# Patient Record
Sex: Male | Born: 1940 | Race: White | Hispanic: No | Marital: Single | Smoking: Never smoker
Health system: Southern US, Community
[De-identification: ages and names within clinical notes are randomized; demographics above are authoritative.]

## PROBLEM LIST (undated history)

## (undated) DIAGNOSIS — I493 Ventricular premature depolarization: Secondary | ICD-10-CM

## (undated) DIAGNOSIS — I10 Essential (primary) hypertension: Secondary | ICD-10-CM

## (undated) DIAGNOSIS — I4891 Unspecified atrial fibrillation: Secondary | ICD-10-CM

## (undated) DIAGNOSIS — I499 Cardiac arrhythmia, unspecified: Secondary | ICD-10-CM

## (undated) DIAGNOSIS — F419 Anxiety disorder, unspecified: Secondary | ICD-10-CM

## (undated) DIAGNOSIS — N402 Nodular prostate without lower urinary tract symptoms: Secondary | ICD-10-CM

## (undated) DIAGNOSIS — G4733 Obstructive sleep apnea (adult) (pediatric): Secondary | ICD-10-CM

## (undated) DIAGNOSIS — R002 Palpitations: Secondary | ICD-10-CM

## (undated) DIAGNOSIS — M353 Polymyalgia rheumatica: Secondary | ICD-10-CM

## (undated) DIAGNOSIS — E119 Type 2 diabetes mellitus without complications: Secondary | ICD-10-CM

## (undated) DIAGNOSIS — E538 Deficiency of other specified B group vitamins: Secondary | ICD-10-CM

## (undated) HISTORY — DX: Type 2 diabetes mellitus without complications: E11.9

## (undated) HISTORY — DX: Polymyalgia rheumatica: M35.3

## (undated) HISTORY — DX: Palpitations: R00.2

## (undated) HISTORY — PX: NO PAST SURGERIES: SHX2092

## (undated) HISTORY — DX: Nodular prostate without lower urinary tract symptoms: N40.2

## (undated) HISTORY — DX: Cardiac arrhythmia, unspecified: I49.9

## (undated) HISTORY — DX: Anxiety disorder, unspecified: F41.9

## (undated) HISTORY — DX: Ventricular premature depolarization: I49.3

## (undated) HISTORY — DX: Obstructive sleep apnea (adult) (pediatric): G47.33

## (undated) HISTORY — DX: Essential (primary) hypertension: I10

## (undated) HISTORY — DX: Deficiency of other specified B group vitamins: E53.8

## (undated) HISTORY — DX: Unspecified atrial fibrillation: I48.91

---

## 2003-12-27 ENCOUNTER — Ambulatory Visit: Payer: Self-pay | Admitting: Family Medicine

## 2004-02-07 ENCOUNTER — Ambulatory Visit: Payer: Self-pay | Admitting: Family Medicine

## 2004-03-26 ENCOUNTER — Ambulatory Visit: Payer: Self-pay | Admitting: Family Medicine

## 2004-08-23 ENCOUNTER — Ambulatory Visit: Payer: Self-pay | Admitting: Family Medicine

## 2005-01-08 ENCOUNTER — Ambulatory Visit: Payer: Self-pay | Admitting: Family Medicine

## 2005-02-25 ENCOUNTER — Ambulatory Visit: Payer: Self-pay | Admitting: Family Medicine

## 2008-09-19 ENCOUNTER — Ambulatory Visit: Payer: Self-pay | Admitting: Cardiovascular Disease

## 2008-10-17 ENCOUNTER — Ambulatory Visit: Payer: Self-pay | Admitting: Cardiovascular Disease

## 2009-02-23 ENCOUNTER — Ambulatory Visit: Payer: Self-pay | Admitting: Cardiovascular Disease

## 2009-03-28 ENCOUNTER — Encounter: Payer: Self-pay | Admitting: Cardiovascular Disease

## 2010-03-20 NOTE — Procedures (Signed)
Summary: summary report  summary report   Imported By: Mirna Mires 03/28/2009 15:49:49  _____________________________________________________________________  External Attachment:    Type:   Image     Comment:   External Document

## 2010-07-03 NOTE — Assessment & Plan Note (Signed)
[facility]                        [place] [facility] OFFICE NOTE   NAME:SCALPATIDaschel, Roughton                      MRN:          [id number]  DATE:02/23/2009                            DOB:          06/26/1940    PROBLEM LIST:  1. Hypertension.  2. Obstructive sleep apnea.  3. Obesity.  4. Diabetes.  5. Premature atrial complexes and premature ventricular complexes      during a sleep study.   INTERVAL HISTORY:  Since his last visit, the patient is doing quite  well.  He denies any episodes of chest discomfort or significant  shortness of breath.  He recently had a death in his family that was  upsetting him.  He continues to wear CPAP mask for obstructive sleep  apnea, although he states it has been uncomfortable, however that has  been improving slightly.   PHYSICAL EXAM:  VITAL SIGNS:  Today, his blood pressure is 120/61, pulse  88, satting 96% on room air, and he weighs 231 pounds which is 5 pounds  more than he weighed at the end of August.  GENERAL:  He is in no acute distress.  HEENT:  Normocephalic, atraumatic.  NECK:  Supple.  No JVD.  No carotid bruits.  HEART:  Regular rate and rhythm without murmur, rub, or gallop.  LUNGS:  Clear bilaterally.  ABDOMEN:  Soft, nontender, nondistended.  EXTREMITIES:  Without edema.  SKIN:  Warm and dry.   I reviewed the patient's labs.  His LDL is 47, his HDL is 44, total  cholesterol is 111, his triglycerides 101.  His hemoglobin A1c is 7.3,  fasting glucose is 127.   ASSESSMENT/PLAN:  1. Hypertension.  The patient's blood pressure is under good control.      He should continue hydrochlorothiazide 12.5 mg daily and Exforge      which is a combination of amlodipine and valsartan 10/320 mg daily.  2. Obesity.  The patient is encouraged to exercise more regularly and      more frequently a total of 30 minutes a day everyday of the week      should be his current goal.  He is reminded that as he lose  weight,      his blood pressure, hypertension, and obstructive sleep apnea are      likely to improve.  3. Lipids.  The patient is on 5 mg of Lipitor and his fasting lipid      profile is excellent.   The patient is followed regularly by Dr. Sol Passer who is addressing his  cardiovascular risk factors.  The patient is instructed to contact our  office in the future if we can be of any further assistance.     Brayton El, MD  Electronically Signed    SGA/MedQ  DD: 02/23/2009  DT: 02/24/2009  Job #: [id number]   cc:   Dina Rich

## 2010-07-03 NOTE — Assessment & Plan Note (Signed)
[facility]                        [place] [facility] OFFICE NOTE   NAME:Wayne [name], Wayne [name]                      MRN:          [id number]  DATE:09/19/2008                            DOB:          26-Sep-1940    CHIEF COMPLAINT:  Obstructive sleep apnea and palpitations.   HISTORY OF PRESENT ILLNESS:  Wayne [name] is a 69 year old white male,  past medical history significant for a recent diagnosis of obstructive  sleep apnea, hypertension, very well controlled diabetes, who is  presenting for evaluation of some arrhythmias during his sleep study.  Patient states that he has been in his normal state of health.  He sees  Dr. Sol Passer on a regular basis who treats him for hypertension and  diabetes.  He states his diabetes has been in excellent control.  Recently, a sleep study was ordered which was positive for obstructive  sleep apnea.  It was also noted that during this sleep study the patient  had multiple PACs and PVCs.  Patient states that at night he  occasionally feels palpitations but they are not bothersome to him.  His  wife states that he snores a fair amount.  Patient also endorses  sleepiness during the daytime.  He denies any episodes of chest  discomfort.  He states if he walks long distances he gets short of  breath, however, this has been the case for quite some time now and has  not changed recently.  He denies any dizziness or syncopal episodes.   PAST MEDICAL HISTORY:  As above.   SOCIAL HISTORY:  No tobacco.  He drinks 2 to 3 glasses of wine daily.  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   FAMILY HISTORY:  He had a father who died in his 17s from some type of  heart problem although he is not sure what the heart problem is.  He  states he has 4 or 5 siblings that are all older than him that have not  had any cardiac issues.   ALLERGIES:  NO KNOWN DRUG ALLERGIES.   MEDICATIONS:  1. Aspirin 81 mg daily.  2.  Hydrochlorothiazide 12.5 mg daily.  3. Exforge 10/320 mg daily.  4. Lipitor 5 mg daily.  5. Glimepiride 2 mg p.r.n.  6. Actoplus Metformin 15/850 t.i.d.   REVIEW OF SYSTEMS:  As in HPI.  All other systems were reviewed and are  negative.   PHYSICAL EXAM:  Pulse 91.  Blood pressure 152/75.  Saturating 97% on  room air.  He weighs 222 pounds.  GENERAL:  He is in no acute distress.  HEENT:  Nonfocal.  Extraocular movements are intact.  NECK:  Supple.  There is no JVD.  There is no carotid bruit.  There is  no lymphadenopathy.  HEART:  Regular rate and rhythm without murmur, rub, or gallop.  LUNGS:  Clear bilaterally.  ABDOMEN:  Soft and nontender, however, there is abdominal obesity.  EXTREMITIES:  Without edema.  SKIN:  Warm and dry without evidence of rash or lesion.  NEURO:  Exam is nonfocal.  PSYCHIATRIC:  Patient is appropriate with  normal levels of insight.  PULSES:  The patient has 2+ bilateral carotid, radial, and posterior  tibial pulses.   EKG independently reviewed by myself demonstrates normal sinus rhythm  without any complete right bundle branch block.  There are no ST or T-  wave abnormalities. Patient's last hemoglobin A1c as of March of this  year was 6.3.  Review of the results of the patient's transthoracic  echocardiogram dated May 05, 2008, shows a ejection fraction of 65%,  possible diastolic dysfunction, and possible left atrial enlargement.   ASSESSMENT:  This is a 70 year old white male with a new diagnosis of  obstructive sleep apnea.  During a sleep study, he appeared to have  multiple episodes of premature atrial contractions and premature  ventricular contractions which is not unexpected in someone with his  degree of obstructive sleep apnea.  He is currently not having any signs  or symptoms of obstructive coronary disease or congestive heart failure.  His hypertension per him is well controlled as he checks it at home  often and it is always within  normal limits even though it is slightly  elevated today in the office.  His diabetes is also under excellent  control per his last hemoglobin A1c.   PLAN:  Patient is scheduled to undergo titration of CPAP tomorrow for  his obstructive sleep apnea.  He is counseled at length as for the need  and the importance of regularly wearing his CPAP mask in the evenings.  It is likely that adequate treatment of the obstructive sleep apnea will  lead to improvement in the patient's arrhythmia although the arrhythmia  is rather benign.  Patient is also counseled regarding weight loss which  should help address the degree of obstructive sleep apnea that he has.  Once the patient has been wearing his CPAP mask for a week or so, we  would like to place him on a 48-hour Holter monitor to assure that the  only arrhythmias that he is having are benign.  We will see the patient  back in approximately 4 weeks' time.     Brayton El, MD  Electronically Signed    SGA/MedQ  DD: 09/19/2008  DT: 09/19/2008  Job #: [id number]

## 2010-07-03 NOTE — Letter (Signed)
September 19, 2008    Dr. Dina Rich  [address]  [place], [place] [address]   RE:  Wayne, [name]  MRN:  [id number]  /  DOB:  12-Jan-1941   Dear Dr. Sol Passer,   I had the pleasure of seeing your patient, Wayne [name], in the  [facility] today.  As you know, he is a 70 year old  [place] American male who has recently been diagnosed with obstructive  sleep apnea.  During his sleep study it appears that he had multiple  episodes of PACs and PVCs, which is not surprising considering his  degree of obstructive sleep apnea.  In speaking with him it does not  appear that he has any signs or symptoms of obstructive coronary disease  and that his risk factors for coronary disease have been very well  managed.  He is currently scheduled to undergo CPAP titration.  I have  counseled him at length regarding the need for compliance with the CPAP  and regarding the need for weight loss in order to improved the symptoms  of his obstructive sleep apnea.  Once he is on the CPAP I will place him  on a cardiac event monito to assure that he is not having any  arrhythmias other than PACs and PVCs.  Lastly, his echocardiogram from  earlier on in the year demonstrated normal left ventricular systolic  function and some evidence of diastolic dysfunction which again is not  uncommon for someone in his age group.  At this time I see no need for  further imaging studies.  However I will see him back in four week's  time to gauge his response to the CPAP therapy, to go over the results  of his Holter Monitor with him, and reevaluate his cardiac status.  I  hope that you are happy with this approach.  Thank you again for the  opportunity to see this patient.  Please feel free to contact my office  at any time with any questions or concerns.    Sincerely,      Wayne El, MD  Electronically Signed    SGA/MedQ  DD: 09/19/2008  DT: 09/19/2008  Job #: [id number]

## 2010-07-03 NOTE — Assessment & Plan Note (Signed)
[facility]                        [place] [facility] OFFICE NOTE   NAME:SCALPATIKamaree, Wheatley                      MRN:          [id number]  DATE:10/17/2008                            DOB:          11/26/1940    PROBLEM LIST:  1. Hypertension.  2. Obstructive sleep apnea.  3. Diabetes.  4. Premature atrial contractions and premature ventricular      contractions during a sleep study.   INTERVAL HISTORY:  Since Mr. Scheier last visit, he wore a 48-hour  Holter monitor that showed some episodes of sinus tachycardia that  occurred during some yard work outside.  Other than that, there were no  significant arrhythmias.  The patient states that he continues to be  getting along well.  He has worn the CPAP mask for approximately 9 days  and is tolerating it reasonably well.  He states that the CPAP is set to  start at 5 and to self-titrate up throughout the night, and that this  self-titration occasionally wakes him up; however, he was told that he  is likely get used to this over the next couple of weeks and will no  longer wake up at night.  The patient states he has not had any episodes  of chest discomfort or dyspnea on exertion.  He also denies any  dizziness, syncope or palpitations.  He is compliant with all of his  medications.  He has been taking his blood pressure in the mornings and  that systolic blood pressures have been in the high 130s.   REVIEW OF SYSTEMS:  As in the HPI, otherwise negative.   PHYSICAL EXAMINATION:  The patient has a blood pressure of 141/70, pulse  91, sating 98% on room air.  He weights 226 pounds.  GENERAL:  He is in no acute distress.  HEENT:  Nonfocal.  NECK:  Supple.  There are no carotid bruits, there is no JVD.  HEART:  Regular rate and rhythm without murmur, rub or gallop.  LUNGS:  Clear to auscultation bilaterally.  ABDOMEN:  Soft, nontender, nondistended.  EXTREMITIES:  Without edema.  PSYCHIATRIC:  The  patient is appropriate with normal levels of insight.   Review of the patient's Holter monitor as above in the HPI.   ASSESSMENT AND PLAN:  The patient is doing well from a cardiovascular  standpoint.  His blood pressure is under marginal control, and we  recommend that he check his blood pressure and record it every morning  and every evening for one week.  The patient will drop off his blood  pressure readings with [place] and we will titrate his blood pressure  medicines accordingly.  It is likely that we will increase his  hydrochlorothiazide from 12.5 mg daily to 25 mg daily.  The patient is  not displaying any symptoms consistent with obstructive coronary disease  or  malignant arrhythmia; therefore, we will not undergo any further testing  or intervention at this  time.  The patient will contact our office if any problems were to  arise.  Otherwise, we will see him in followup in 4 months time.  Brayton El, MD  Electronically Signed    SGA/MedQ  DD: 10/17/2008  DT: 10/17/2008  Job #: [id number]

## 2010-11-06 ENCOUNTER — Encounter: Payer: Self-pay | Admitting: Cardiovascular Disease

## 2010-11-08 ENCOUNTER — Encounter: Payer: Self-pay | Admitting: Cardiovascular Disease

## 2010-12-10 DIAGNOSIS — R972 Elevated prostate specific antigen [PSA]: Secondary | ICD-10-CM

## 2010-12-10 HISTORY — DX: Elevated prostate specific antigen (PSA): R97.20

## 2010-12-27 ENCOUNTER — Encounter: Payer: Self-pay | Admitting: Cardiovascular Disease

## 2012-01-20 ENCOUNTER — Observation Stay (HOSPITAL_COMMUNITY)
Admission: EM | Admit: 2012-01-20 | Discharge: 2012-01-21 | Disposition: A | Discharge status: Home / Self Care | Payer: Medicare Other | Attending: Internal Medicine | Admitting: Internal Medicine

## 2012-01-20 ENCOUNTER — Emergency Department (HOSPITAL_COMMUNITY): Payer: Medicare Other

## 2012-01-20 ENCOUNTER — Encounter (HOSPITAL_COMMUNITY): Payer: Self-pay | Admitting: *Deleted

## 2012-01-20 DIAGNOSIS — I959 Hypotension, unspecified: Secondary | ICD-10-CM | POA: Diagnosis present

## 2012-01-20 DIAGNOSIS — E119 Type 2 diabetes mellitus without complications: Secondary | ICD-10-CM

## 2012-01-20 DIAGNOSIS — E782 Mixed hyperlipidemia: Secondary | ICD-10-CM | POA: Diagnosis present

## 2012-01-20 DIAGNOSIS — I119 Hypertensive heart disease without heart failure: Secondary | ICD-10-CM | POA: Diagnosis not present

## 2012-01-20 DIAGNOSIS — N179 Acute kidney failure, unspecified: Secondary | ICD-10-CM

## 2012-01-20 DIAGNOSIS — E860 Dehydration: Secondary | ICD-10-CM

## 2012-01-20 DIAGNOSIS — Z9989 Dependence on other enabling machines and devices: Secondary | ICD-10-CM

## 2012-01-20 DIAGNOSIS — E669 Obesity, unspecified: Secondary | ICD-10-CM | POA: Diagnosis present

## 2012-01-20 DIAGNOSIS — Z79899 Other long term (current) drug therapy: Secondary | ICD-10-CM | POA: Insufficient documentation

## 2012-01-20 DIAGNOSIS — G4733 Obstructive sleep apnea (adult) (pediatric): Secondary | ICD-10-CM | POA: Diagnosis present

## 2012-01-20 DIAGNOSIS — I498 Other specified cardiac arrhythmias: Principal | ICD-10-CM | POA: Insufficient documentation

## 2012-01-20 DIAGNOSIS — I471 Supraventricular tachycardia, unspecified: Secondary | ICD-10-CM

## 2012-01-20 DIAGNOSIS — E785 Hyperlipidemia, unspecified: Secondary | ICD-10-CM

## 2012-01-20 DIAGNOSIS — R42 Dizziness and giddiness: Secondary | ICD-10-CM | POA: Insufficient documentation

## 2012-01-20 DIAGNOSIS — R197 Diarrhea, unspecified: Secondary | ICD-10-CM | POA: Insufficient documentation

## 2012-01-20 DIAGNOSIS — I10 Essential (primary) hypertension: Secondary | ICD-10-CM | POA: Insufficient documentation

## 2012-01-20 DIAGNOSIS — D649 Anemia, unspecified: Secondary | ICD-10-CM | POA: Diagnosis present

## 2012-01-20 DIAGNOSIS — R031 Nonspecific low blood-pressure reading: Secondary | ICD-10-CM | POA: Insufficient documentation

## 2012-01-20 HISTORY — DX: Type 2 diabetes mellitus without complications: E11.9

## 2012-01-20 HISTORY — DX: Mixed hyperlipidemia: E78.2

## 2012-01-20 HISTORY — DX: Hypotension, unspecified: I95.9

## 2012-01-20 HISTORY — DX: Hypertensive heart disease without heart failure: I11.9

## 2012-01-20 HISTORY — DX: Supraventricular tachycardia, unspecified: I47.10

## 2012-01-20 HISTORY — DX: Obstructive sleep apnea (adult) (pediatric): G47.33

## 2012-01-20 HISTORY — DX: Acute kidney failure, unspecified: N17.9

## 2012-01-20 HISTORY — DX: Dehydration: E86.0

## 2012-01-20 HISTORY — DX: Supraventricular tachycardia: I47.1

## 2012-01-20 LAB — CBC
HCT: 37 % — ABNORMAL LOW (ref 39.0–52.0)
HCT: 31 % — ABNORMAL LOW (ref 39.0–52.0)
Hemoglobin: 12.2 g/dL — ABNORMAL LOW (ref 13.0–17.0)
Hemoglobin: 10.2 g/dL — ABNORMAL LOW (ref 13.0–17.0)
MCH: 29.5 pg (ref 26.0–34.0)
MCH: 29.6 pg (ref 26.0–34.0)
MCHC: 33 g/dL (ref 30.0–36.0)
MCHC: 32.9 g/dL (ref 30.0–36.0)
MCV: 89.6 fL (ref 78.0–100.0)
Platelets: 192 10*3/uL (ref 150–400)
RBC: 4.13 MIL/uL — ABNORMAL LOW (ref 4.22–5.81)
RDW: 14.5 % (ref 11.5–15.5)
RDW: 14.4 % (ref 11.5–15.5)
WBC: 8.2 10*3/uL (ref 4.0–10.5)
WBC: 4.6 10*3/uL (ref 4.0–10.5)

## 2012-01-20 LAB — BASIC METABOLIC PANEL
BUN: 49 mg/dL — ABNORMAL HIGH (ref 6–23)
CO2: 17 mEq/L — ABNORMAL LOW (ref 19–32)
Calcium: 9.4 mg/dL (ref 8.4–10.5)
Chloride: 102 mEq/L (ref 96–112)
Creatinine, Ser: 2.35 mg/dL — ABNORMAL HIGH (ref 0.50–1.35)
GFR calc Af Amer: 30 mL/min — ABNORMAL LOW (ref >90)
GFR calc non Af Amer: 26 mL/min — ABNORMAL LOW (ref >90)
Glucose, Bld: 212 mg/dL — ABNORMAL HIGH (ref 70–99)
Potassium: 4.1 mEq/L (ref 3.5–5.1)
Sodium: 135 mEq/L (ref 135–145)

## 2012-01-20 LAB — GLUCOSE, CAPILLARY: Glucose-Capillary: 157 mg/dL — ABNORMAL HIGH (ref 70–99)

## 2012-01-20 LAB — CREATININE, SERUM: GFR calc non Af Amer: 29 mL/min — ABNORMAL LOW (ref >90)

## 2012-01-20 LAB — POCT I-STAT TROPONIN I: Troponin i, poc: 0.05 ng/mL (ref 0.00–0.08)

## 2012-01-20 MED ORDER — NITROGLYCERIN 0.4 MG SL SUBL: 0.4000 mg | SUBLINGUAL_TABLET | SUBLINGUAL | Status: DC | PRN

## 2012-01-20 MED ORDER — ZOLPIDEM TARTRATE 5 MG PO TABS: 5.0000 mg | ORAL_TABLET | Freq: Every evening | ORAL | Status: DC | PRN

## 2012-01-20 MED ORDER — ATORVASTATIN CALCIUM 10 MG PO TABS
5.0000 mg | ORAL_TABLET | Freq: Every day | ORAL | Status: DC
  Administered 2012-01-20 – 2012-01-21 (×2): 5 mg via ORAL
  Filled 2012-01-20 (×2): qty 0.5

## 2012-01-20 MED ORDER — ALUM & MAG HYDROXIDE-SIMETH 200-200-20 MG/5ML PO SUSP
30.0000 mL | Freq: Four times a day (QID) | ORAL | Status: DC | PRN
  Filled 2012-01-20: qty 30

## 2012-01-20 MED ORDER — ONDANSETRON HCL 4 MG PO TABS: 4.0000 mg | ORAL_TABLET | Freq: Four times a day (QID) | ORAL | Status: DC | PRN

## 2012-01-20 MED ORDER — INSULIN ASPART 100 UNIT/ML ~~LOC~~ SOLN: 0.0000 [IU] | Freq: Every day | SUBCUTANEOUS | Status: DC

## 2012-01-20 MED ORDER — ONDANSETRON HCL 4 MG/2ML IJ SOLN: 4.0000 mg | Freq: Four times a day (QID) | INTRAMUSCULAR | Status: DC | PRN

## 2012-01-20 MED ORDER — INSULIN ASPART 100 UNIT/ML ~~LOC~~ SOLN
0.0000 [IU] | Freq: Three times a day (TID) | SUBCUTANEOUS | Status: DC
  Administered 2012-01-21 (×2): 3 [IU] via SUBCUTANEOUS

## 2012-01-20 MED ORDER — ASPIRIN 325 MG PO TABS
325.0000 mg | ORAL_TABLET | ORAL | Status: AC
  Administered 2012-01-20: 325 mg via ORAL
  Filled 2012-01-20: qty 1

## 2012-01-20 MED ORDER — TAMSULOSIN HCL 0.4 MG PO CAPS
0.4000 mg | ORAL_CAPSULE | Freq: Every day | ORAL | Status: DC
  Administered 2012-01-20 – 2012-01-21 (×2): 0.4 mg via ORAL
  Filled 2012-01-20 (×2): qty 1

## 2012-01-20 MED ORDER — HEPARIN SODIUM (PORCINE) 5000 UNIT/ML IJ SOLN
5000.0000 [IU] | Freq: Three times a day (TID) | INTRAMUSCULAR | Status: DC
  Administered 2012-01-20 – 2012-01-21 (×3): 5000 [IU] via SUBCUTANEOUS
  Filled 2012-01-20 (×5): qty 1

## 2012-01-20 MED ORDER — SODIUM CHLORIDE 0.9 % IJ SOLN: 3.0000 mL | Freq: Two times a day (BID) | INTRAMUSCULAR | Status: DC

## 2012-01-20 MED ORDER — SODIUM CHLORIDE 0.9 % IV SOLN
INTRAVENOUS | Status: DC
  Administered 2012-01-20 – 2012-01-21 (×2): via INTRAVENOUS

## 2012-01-20 MED ORDER — SODIUM CHLORIDE 0.9 % IV BOLUS (SEPSIS)
1000.0000 mL | Freq: Once | INTRAVENOUS | Status: AC
  Administered 2012-01-20: 1000 mL via INTRAVENOUS

## 2012-01-20 NOTE — ED Provider Notes (Signed)
History     CSN: [id number]  Arrival date & time 01/20/12  1551   First MD Initiated Contact with Patient 01/20/12 1642     Chief Complaint  Patient presents with  . Tachycardia   HPI: Mr. Ludwig Lean is a 71 yo CM with history of OSA, HTN, DM who presents with palpitations, light-headedness and fatigue. Symptoms started three days ago with indigestion and diarrhea. He had 7 episodes of loose stool three days ago. He took Immodium and since that time diarrhea has been improving. His indigestion persisted for two days but resolved yesterday. For the last couple of days he has noted palpitations and elevated HR into the 160's at home but he did not seek care. He has noted light-headedness and decreased exercise tolerance for two days as well. He denies chest pain, shortness of breath, nausea, diaphoresis or dark stools. He went to a normally scheduled Urology appointment today, he was noted to be hypotensive with a systolic blood pressure in the high 80's and tachycardic to 170's, so his urologist recommended he come to the ED for evaluation.   Past Medical History  Diagnosis Date  . OSA (obstructive sleep apnea)   . Palpitations   . HTN (hypertension)   . DM (diabetes mellitus)   . PVC's (premature ventricular contractions)     History reviewed. No pertinent past surgical history.  Family History  Problem Relation Age of Onset  . Heart disease Father     History  Substance Use Topics  . Smoking status: Never Smoker   . Smokeless tobacco: Not on file  . Alcohol Use: 10.8 oz/week    18 Glasses of wine per week      Review of Systems  Constitutional: Positive for fatigue. Negative for chills and unexpected weight change.  HENT: Negative for congestion and rhinorrhea.   Eyes: Negative for photophobia and visual disturbance.  Respiratory: Negative for cough and shortness of breath.   Cardiovascular: Positive for palpitations. Negative for chest pain.  Gastrointestinal: Positive  for diarrhea. Negative for nausea, vomiting and abdominal pain.  Genitourinary: Negative for dysuria, urgency and decreased urine volume.  Musculoskeletal: Negative for myalgias, back pain and arthralgias.  Skin: Negative for pallor and rash.  Neurological: Positive for light-headedness. Negative for speech difficulty, weakness and headaches.  Psychiatric/Behavioral: Negative for confusion and agitation.   Allergies  Review of patient's allergies indicates no known allergies.  Home Medications   Current Outpatient Rx  Name  Route  Sig  Dispense  Refill  . ASPIRIN 81 MG PO TABS   Oral   Take 81 mg by mouth daily.           . ATORVASTATIN CALCIUM 10 MG PO TABS   Oral   Take 5 mg by mouth daily.           . IRBESARTAN-HYDROCHLOROTHIAZIDE 150-12.5 MG PO TABS   Oral   Take 1 tablet by mouth 2 (two) times daily.          Marland Kitchen PIOGLITAZONE HCL-METFORMIN HCL 15-850 MG PO TABS   Oral   Take 1 tablet by mouth daily.          Marland Kitchen TAMSULOSIN HCL 0.4 MG PO CAPS   Oral   Take 0.4 mg by mouth daily.             BP 92/54  Pulse 175  Temp 98.3 F (36.8 C) (Oral)  Resp 18  SpO2 98%  Physical Exam  Nursing note and vitals reviewed.  Constitutional: He is oriented to person, place, and time. He is cooperative. No distress.       Overweight elderly male, no acute distress   HENT:  Head: Normocephalic and atraumatic.  Mouth/Throat: Mucous membranes are dry.  Eyes: Conjunctivae normal and EOM are normal. Pupils are equal, round, and reactive to light.  Neck: Trachea normal and full passive range of motion without pain.  Cardiovascular: Regular rhythm, S1 normal, S2 normal, normal heart sounds and normal pulses.  Tachycardia present.   Pulmonary/Chest: Effort normal and breath sounds normal. He has no rales.  Abdominal: Soft. Normal appearance and bowel sounds are normal. There is no tenderness.  Neurological: He is alert and oriented to person, place, and time. No cranial nerve  deficit or sensory deficit. GCS eye subscore is 4. GCS verbal subscore is 5. GCS motor subscore is 6.    ED Course  Procedures  Labs Reviewed  CBC - Abnormal; Notable for the following:    RBC 4.13 (*)     Hemoglobin 12.2 (*)     HCT 37.0 (*)     All other components within normal limits  BASIC METABOLIC PANEL - Abnormal; Notable for the following:    CO2 17 (*)     Glucose, Bld 212 (*)     BUN 49 (*)     Creatinine, Ser 2.35 (*)     GFR calc non Af Amer 26 (*)     GFR calc Af Amer 30 (*)     All other components within normal limits  CBC - Abnormal; Notable for the following:    RBC 3.45 (*)     Hemoglobin 10.2 (*)     HCT 31.0 (*)     All other components within normal limits  CREATININE, SERUM - Abnormal; Notable for the following:    Creatinine, Ser 2.12 (*)     GFR calc non Af Amer 29 (*)     GFR calc Af Amer 34 (*)     All other components within normal limits  GLUCOSE, CAPILLARY - Abnormal; Notable for the following:    Glucose-Capillary 157 (*)     All other components within normal limits  POCT I-STAT TROPONIN I  BASIC METABOLIC PANEL  CBC   Dg Chest 2 View  01/20/2012  *RADIOLOGY REPORT*  Clinical Data: Hypotension and tachycardia  CHEST - 2 VIEW  Comparison: None  Findings: The heart size is mildly enlarged.  The mediastinal contours are within normal limits.  Both lungs are clear.  The visualized skeletal structures are unremarkable.  IMPRESSION: No active cardiopulmonary abnormalities.   Original Report Authenticated By: Signa Kell, M.D.    1. Dehydration   2. Acute kidney injury    MDM  71 yo CM with history of OSA, HTN, DM who presents with palpitations, light-headedness and fatigue. Afebrile, tachycardic to 170's, on arrival but converted to ST spontaneously. Mild hypotension. Symptoms likely related to volume depletion. Diarrheal illness preceding current symptoms appears to be improving. ECG without evidence of acute ischemia. Doubt ACS as ECG without  acute ischemia, troponin 0.05. Doubt PE as no chest pain or hypoxia. Patient's cr elevated to 2.35, no comparison but BUN 49 so likely an acute component. WBC 4.6 and Hgb 10.2, anemia not cause of tachycardia. Patient with acute kidney failure secondary to volume depletion which is likely contributing to other symptoms as well. No other obvious cause for tachycardia but this resolved spontaneously. Patient admitted to [facility] for continued hydration and  further management of acute renal failure. He responded well to fluids in the ED with improvement of HR into the 90's and improvement of SBP to high 90's. Discussed w/u with patient and need for inpatient admission, he is in agreement with plan.   Reviewed labs and previous medical records, utilized in MDM  Discussed case with Dr. Juleen China  ECG: ST, HR 117, normal axis, occ PVC, T wave inversion in lead V1. Poor R wave progression. No previous ECG for comparison.   Clinical Impression 1. Acute renal failure 2. SVT, resolved 3. Anemia of unknown origin        Margie Billet, MD 01/21/12 [phone]

## 2012-01-20 NOTE — ED Notes (Signed)
Pt reports rapid HR x several days.  States that he has had diarrhea and heartburn x several days.  Denies SOB, denies N/V.  Pt noted to be diaphoretic.

## 2012-01-20 NOTE — H&P (Signed)
PCP:   Dina Rich, MD  Urology: Dr. Gaynelle Arabian  Chief Complaint:  Tachycardia and hypotension  HPI: This is a pleasant 71 year old [name] who states he had diarrhea all night Friday. He took Imodium which helped, however, he's felt ill since, he's also had some persistent nausea. On Saturday he checked His pulse, it was in the 160s. He continued to feel badly all weekend, today he went for his routine appointment with his doctor, his blood pressure was checked it was 80/50, his pulse was in the 160s. He was sent to the ER where the patient had pulse in the 170s, SVT. This has spontaneous resolved, I cannot a find copy of  the EKG. THE PATIENT DENIES ANY CHEST PAINS.he additionally denies any  FEVER, PERSISTENT DIARRHEA, NAUSEA. THE PATIENT DOES HAVE A HISTORY OF OBSTRUCTIVE SLEEP APNEA, HE STATES MANY YEARS AGO HE HAD AN ECHOCARDIOGRAM and was told he reflected evidence of  OBSTRUCTIVE SLEEP APNEA, HE IS UNCLEAR WHAT evidence. HE WAS PRESCRIBED A CPAP MACHINE WHICH HE OCCASIONALLY USES. HISTORY PROVIDED THE PATIENT AND FAMILY WAS ALERT AND ORIENTED.   Review of Systems:  The patient denies anorexia, fever, weight loss,, vision loss, decreased hearing, hoarseness, chest pain, syncope, dyspnea on exertion, peripheral edema, balance deficits, hemoptysis, abdominal pain, melena, hematochezia, severe indigestion/heartburn, hematuria, incontinence, genital sores, muscle weakness, suspicious skin lesions, transient blindness, difficulty walking, depression, unusual weight change, abnormal bleeding, enlarged lymph nodes, angioedema, and breast masses.  Past Medical History: Past Medical History  Diagnosis Date  . OSA (obstructive sleep apnea)   . Palpitations   . HTN (hypertension)   . DM (diabetes mellitus)   . PVC's (premature ventricular contractions)    History reviewed. No pertinent past surgical history.  Medications: Prior to Admission medications   Medication Sig Start Date End Date  Taking? Authorizing Provider  aspirin 81 MG tablet Take 81 mg by mouth daily.     Yes Historical Provider, MD  atorvastatin (LIPITOR) 10 MG tablet Take 5 mg by mouth daily.     Yes Historical Provider, MD  irbesartan-hydrochlorothiazide (AVALIDE) 150-12.5 MG per tablet Take 1 tablet by mouth 2 (two) times daily.    Yes Historical Provider, MD  pioglitazone-metformin (ACTOPLUS MET) 15-850 MG per tablet Take 1 tablet by mouth daily.    Yes Historical Provider, MD  Tamsulosin HCl (FLOMAX) 0.4 MG CAPS Take 0.4 mg by mouth daily.     Yes Historical Provider, MD    Allergies:  No Known Allergies  Social History:  reports that he has never smoked. He does not have any smokeless tobacco history on file. He reports that he drinks about 10.8 ounces of alcohol per week. His drug history not on file.  Family History: Family History  Problem Relation Age of Onset  . Heart disease Father     Physical Exam: Filed Vitals:   01/20/12 1600 01/20/12 1630 01/20/12 1730 01/20/12 2100  BP: 92/54 115/57 116/59 123/62  Pulse: 175 110 95 79  Temp: 98.3 F (36.8 C)     TempSrc: Oral     Resp: 18 22 20 20   SpO2: 98% 97% 97% 97%    General:  Alert and oriented times three, well developed and nourished, no acute distress Eyes: PERRLA, pink conjunctiva, no scleral icterus ENT: Moist oral mucosa, neck supple, no thyromegaly Lungs: clear to ascultation, no wheeze, no crackles, no use of accessory muscles Cardiovascular: regular rate and rhythm, no regurgitation, no gallops, no murmurs. No carotid bruits, no JVD Abdomen:  soft, positive BS, non-tender, non-distended, no organomegaly, not an acute abdomen GU: not examined Neuro: CN II - XII grossly intact, sensation intact Musculoskeletal: strength 5/5 all extremities, no clubbing, cyanosis or edema Skin: no rash, no subcutaneous crepitation, no decubitus Psych: appropriate patient   Labs on Admission:   [facility] 01/20/12 1618  NA 135  K 4.1  CL 102    CO2 17*  GLUCOSE 212*  BUN 49*  CREATININE 2.35*  CALCIUM 9.4  MG --  PHOS --   No results found for this basename: AST:2,ALT:2,ALKPHOS:2,BILITOT:2,PROT:2,ALBUMIN:2 in the last 72 hours No results found for this basename: LIPASE:2,AMYLASE:2 in the last 72 hours  Basename 01/20/12 1618  WBC 8.2  NEUTROABS --  HGB 12.2*  HCT 37.0*  MCV Wayne.6  PLT 192   No results found for this basename: CKTOTAL:3,CKMB:3,CKMBINDEX:3,TROPONINI:3 in the last 72 hours No components found with this basename: POCBNP:3 No results found for this basename: DDIMER:2 in the last 72 hours No results found for this basename: HGBA1C:2 in the last 72 hours No results found for this basename: CHOL:2,HDL:2,LDLCALC:2,TRIG:2,CHOLHDL:2,LDLDIRECT:2 in the last 72 hours No results found for this basename: TSH,T4TOTAL,FREET3,T3FREE,THYROIDAB in the last 72 hours No results found for this basename: VITAMINB12:2,FOLATE:2,FERRITIN:2,TIBC:2,IRON:2,RETICCTPCT:2 in the last 72 hours  Micro Results: No results found for this or any previous visit (from the past 240 hour(s)).   Radiological Exams on Admission: Dg Chest 2 View  01/20/2012  *RADIOLOGY REPORT*  Clinical Data: Hypotension and tachycardia  CHEST - 2 VIEW  Comparison: None  Findings: The heart size is mildly enlarged.  The mediastinal contours are within normal limits.  Both lungs are clear.  The visualized skeletal structures are unremarkable.  IMPRESSION: No active cardiopulmonary abnormalities.   Original Report Authenticated By: Signa Kell, M.D.     Assessment/Plan Present on Admission:  . Dehydration . Acute kidney injury Transient hypotension Bring in for 23 observation on telemetry Continue IV fluid hydration, repeat BMP in a.m. Discontinued Irbisartan/HCTZ Diarrhea Resolved SVT Likely due to dehydration. ? of enlarged right atria predisposing patient to SVT/atrial fibrillation probably from untreated obstructive sleep apnea. a 2-D echo was not  been ordered, patient advised is important to comply with CPAP machine. Monitor  Diabetes Dyslipidemia Obstructive sleep apnea  stable Resume home medications with except metformin given patient's creatinine ADA diet and sliding scale insulin Patient at high CPAP machine   Full code DVT prophylaxis    Kasarah Sitts 01/20/2012, 9:22 PM

## 2012-01-20 NOTE — ED Notes (Signed)
MD at bedside. 

## 2012-01-20 NOTE — ED Notes (Signed)
Pt reports walking from his doctors office across the street.

## 2012-01-21 DIAGNOSIS — D649 Anemia, unspecified: Secondary | ICD-10-CM

## 2012-01-21 DIAGNOSIS — E785 Hyperlipidemia, unspecified: Secondary | ICD-10-CM

## 2012-01-21 DIAGNOSIS — I471 Supraventricular tachycardia, unspecified: Secondary | ICD-10-CM

## 2012-01-21 DIAGNOSIS — E669 Obesity, unspecified: Secondary | ICD-10-CM

## 2012-01-21 HISTORY — DX: Anemia, unspecified: D64.9

## 2012-01-21 HISTORY — DX: Obesity, unspecified: E66.9

## 2012-01-21 LAB — CBC
HCT: 29.3 % — ABNORMAL LOW (ref 39.0–52.0)
Hemoglobin: 9.9 g/dL — ABNORMAL LOW (ref 13.0–17.0)
MCH: 30.4 pg (ref 26.0–34.0)
MCHC: 33.8 g/dL (ref 30.0–36.0)
MCV: 89.9 fL (ref 78.0–100.0)
RDW: 14.5 % (ref 11.5–15.5)

## 2012-01-21 LAB — BASIC METABOLIC PANEL
BUN: 40 mg/dL — ABNORMAL HIGH (ref 6–23)
CO2: 22 mEq/L (ref 19–32)
Chloride: 107 mEq/L (ref 96–112)
Creatinine, Ser: 1.62 mg/dL — ABNORMAL HIGH (ref 0.50–1.35)
Glucose, Bld: 134 mg/dL — ABNORMAL HIGH (ref 70–99)
Potassium: 3.7 mEq/L (ref 3.5–5.1)

## 2012-01-21 LAB — HEMOGLOBIN A1C: Hgb A1c MFr Bld: 7.3 % — ABNORMAL HIGH (ref <5.7)

## 2012-01-21 LAB — IRON AND TIBC
Iron: 22 ug/dL — ABNORMAL LOW (ref 42–135)
Saturation Ratios: 8 % — ABNORMAL LOW (ref 20–55)
TIBC: 290 ug/dL (ref 215–435)
UIBC: 268 ug/dL (ref 125–400)

## 2012-01-21 LAB — FOLATE: Folate: 13.6 ng/mL

## 2012-01-21 LAB — GLUCOSE, CAPILLARY
Glucose-Capillary: 161 mg/dL — ABNORMAL HIGH (ref 70–99)
Glucose-Capillary: 153 mg/dL — ABNORMAL HIGH (ref 70–99)

## 2012-01-21 LAB — RETICULOCYTES: Retic Count, Absolute: 29.2 10*3/uL (ref 19.0–186.0)

## 2012-01-21 MED ORDER — ALUM & MAG HYDROXIDE-SIMETH 200-200-20 MG/5ML PO SUSP: 30.0000 mL | Freq: Four times a day (QID) | ORAL | Status: DC | PRN

## 2012-01-21 NOTE — Progress Notes (Signed)
Utilization review completed.  

## 2012-01-21 NOTE — Progress Notes (Signed)
Patient seen and examined by me.  Patient anxious to go home today.  Will try to reach out to PCP and find baseline of Cr and Hgb.  In the meantime will get echo and FOBT.  Overall patient feeling better.  Marlin Canary DO

## 2012-01-21 NOTE — Plan of Care (Signed)
Problem: Limited Adherence to Nutrition-Related Recommendations (NB-1.6) Goal: Nutrition education Formal process to instruct or train a patient/client in a skill or to impart knowledge to help patients/clients voluntarily manage or modify food choices and eating behavior to maintain or improve health.  Outcome: Completed/Met Date Met:  01/21/12  Nutrition Education Note  RD consulted for nutrition education regarding weight loss.  RD provided "Weight Loss Tips" handout from the Academy of [employer]. Discussed techniques on ways to decrease overall PO intake, pay attention to portion sizes, and choose leaner meats and dairy products. Pt verbalized understanding and states that he has struggled with weight gain for quite some time. Provided emotional support and encouraged compliance.  Expect poor compliance.  Body mass index is 33.45 kg/(m^2). Pt meets criteria for Obese Class I based on current BMI.  Current diet order is Carbohydrate Modified Medium, patient is consuming approximately 100% of meals at this time. Labs and medications reviewed. No further nutrition interventions warranted at this time. RD contact information provided. If additional nutrition issues arise, please re-consult RD.  Jarold Motto MS, RD, LDN Pager: [phone] After-hours pager: [phone]

## 2012-01-21 NOTE — Progress Notes (Signed)
[facility] PROGRESS NOTE  Wayne [name] JYN:[id number] DOB: 07-14-1940 DOA: 01/20/2012 PCP: Dina Rich, MD  Assessment/Plan:   SVT: likely related to dehydration from persistent diarrhea 2 days ago. Resolved this am. Tele without event. Hx non-compliance with CPAP and some concern for enlarged atrium. Will check 2decho.  Hypotension: likely related to dehydration from persistent diarrhea. Resolved this am after IV fluids. Will decrease rate of fluids. Pt taking po without problem. Continue to hold home anti-hypertensive. Monitor closely Acute kidney injury: likely as a result of #2. Creatinine improving.  No information on baseline creatinine. Will continue IV fluids at a lower rate. Hold nephrotoxins. Monitor. Will likely need OP follow up with PCP Anemia: hg 9.9. No information on baseline. Suspect related to chronic disease. Will check anemia panel and FOBT. No s/sx active bleeding Dehydration: as result of persistent diarrhea 2 days ago. Improved with IV fluids. Will continue IV but decrease rate. Taking po fluids well. Monitor DM:  Fair control holding home metformin due to #3. Not on insulin at home. Will check A1c and use SSI for glycemic control. Carb modified diet Obstructive sleep apnea: reports non-compliance with CPAP at home. Refused CPAP in hospital. Counseled on importance of CPAP and possible risks of not using. Will check 2decho.  Obesity: Will request nutritional consult    Code Status: full Family Communication: pt at bedside Disposition Plan: home when ready, hopefully this afternoon   Consultants:  none  Procedures:  none  Antibiotics:  none  HPI/Subjective: Awake alert oriented x3. Denies pain/discomfort. NAD  Objective: Filed Vitals:   01/20/12 1730 01/20/12 2100 01/20/12 2125 01/21/12 0607  BP: 116/59 123/62 152/72 119/64  Pulse: 95 79 82 70  Temp:   98.2 F (36.8 C) 97.8 F (36.6 C)  TempSrc:   Oral Oral  Resp: 20 20 20 20   Height:    5\' 8"  (1.727 m)   Weight:   99.8 kg (220 lb 0.3 oz)   SpO2: 97% 97% 100% 99%    Intake/Output Summary (Last 24 hours) at 01/21/12 0918 Last data filed at 01/21/12 0854  Gross per 24 hour  Intake 795.83 ml  Output    850 ml  Net -54.17 ml   Filed Weights   01/20/12 2125  Weight: 99.8 kg (220 lb 0.3 oz)    Exam:   General:  Awake alert oriented x3  Cardiovascular: RRR No MGR No LEE  Respiratory: normal effort. BS somewhat distant. No wheeze/rhonchi  Abdomen: obese, soft +BS non-tender to palpation  Data Reviewed: Basic Metabolic Panel:  Lab 01/21/12 8657 01/20/12 2201 01/20/12 1618  NA 137 -- 135  K 3.7 -- 4.1  CL 107 -- 102  CO2 22 -- 17*  GLUCOSE 134* -- 212*  BUN 40* -- 49*  CREATININE 1.62* 2.12* 2.35*  CALCIUM 8.5 -- 9.4  MG -- -- --  PHOS -- -- --   Liver Function Tests: No results found for this basename: AST:5,ALT:5,ALKPHOS:5,BILITOT:5,PROT:5,ALBUMIN:5 in the last 168 hours No results found for this basename: LIPASE:5,AMYLASE:5 in the last 168 hours No results found for this basename: AMMONIA:5 in the last 168 hours CBC:  Lab 01/21/12 0700 01/20/12 2201 01/20/12 1618  WBC 4.5 4.6 8.2  NEUTROABS -- -- --  HGB 9.9* 10.2* 12.2*  HCT 29.3* 31.0* 37.0*  MCV 89.9 89.9 89.6  PLT 147* 161 192   Cardiac Enzymes: No results found for this basename: CKTOTAL:5,CKMB:5,CKMBINDEX:5,TROPONINI:5 in the last 168 hours BNP (last 3 results) No results found for this basename:  PROBNP:3 in the last 8760 hours CBG:  Lab 01/21/12 0832 01/20/12 2127  GLUCAP 161* 157*    No results found for this or any previous visit (from the past 240 hour(s)).   Studies: Dg Chest 2 View  01/20/2012  *RADIOLOGY REPORT*  Clinical Data: Hypotension and tachycardia  CHEST - 2 VIEW  Comparison: None  Findings: The heart size is mildly enlarged.  The mediastinal contours are within normal limits.  Both lungs are clear.  The visualized skeletal structures are unremarkable.  IMPRESSION:  No active cardiopulmonary abnormalities.   Original Report Authenticated By: Signa Kell, M.D.     Scheduled Meds:    . [COMPLETED] aspirin  325 mg Oral STAT  . atorvastatin  5 mg Oral Daily  . heparin  5,000 Units Subcutaneous Q8H  . insulin aspart  0-15 Units Subcutaneous TID WC  . insulin aspart  0-5 Units Subcutaneous QHS  . [COMPLETED] sodium chloride  1,000 mL Intravenous Once  . sodium chloride  3 mL Intravenous Q12H  . Tamsulosin HCl  0.4 mg Oral Daily   Continuous Infusions:    . sodium chloride 75 mL/hr at 01/21/12 1610    Principal Problem:  *SVT (supraventricular tachycardia) Active Problems:  Dehydration  Hypotension  Acute kidney injury  Diabetes  Hypertension  OSA on CPAP  Hyperlipidemia  Anemia  Obesity    Time spent: 30 minutes    [facility] M  [facility]  If 8PM-8AM, please contact night-coverage at [email or web address], password [facility] 01/21/2012, 9:18 AM  LOS: 1 day

## 2012-01-21 NOTE — Progress Notes (Signed)
  Echocardiogram 2D Echocardiogram has been performed.  Other Atienza 01/21/2012, 11:55 AM 

## 2012-01-21 NOTE — Progress Notes (Signed)
PIV removed. Patient for discharge to home.  Discharge instructions given and patient verbalized understanding.

## 2012-01-21 NOTE — Discharge Summary (Signed)
Physician Discharge Summary  Eugune Sine GUY:[id number] DOB: 1940/05/22 DOA: 01/20/2012  PCP: Dina Rich, MD  Admit date: 01/20/2012 Discharge date: 01/21/2012  Time spent: 40 minutes  Recommendations for Outpatient Follow-up:  1. Pt has appointment with Dr. Sol Passer 01/24/12 for cbc and bmet. Recommend evaluation of renal function, hg. Will need eval creatinine and determine if metformin can be restarted.   Discharge Diagnoses:  Principal Problem:  *SVT (supraventricular tachycardia) Active Problems:  Dehydration  Hypotension  Acute kidney injury  Diabetes  Hypertension  OSA on CPAP  Hyperlipidemia  Anemia  Obesity   Discharge Condition: stable   Diet recommendation: carb modified  Filed Weights   01/20/12 2125  Weight: 99.8 kg (220 lb 0.3 oz)    History of present illness:   This  pleasant 71 year old gentleman who stated he had diarrhea all night 3 nights prior to presentation. He reported taking Imodium which helped, however, he reported feeling ill ever since. Two days prior to presentation he checked His pulse, it was in the 160s. He continued to feel badly all weekend, on 01/20/12 he went for his routine appointment with his doctor, his blood pressure was checked it was 80/50, his pulse was in the 160s. He was sent to the ER where the patient had pulse in the 170s, SVT. This had spontaneously resolved, in ED. THE PATIENT DENIEd ANY CHEST PAINS.he additionally denied any FEVER, PERSISTENT DIARRHEA, NAUSEA. THE PATIENT DOES HAVE A HISTORY OF OBSTRUCTIVE SLEEP APNEA, HE STATEd MANY YEARS AGO HE HAD AN ECHOCARDIOGRAM and was told he reflected evidence of OBSTRUCTIVE SLEEP APNEA, HE IS UNCLEAR WHAT evidence. HE WAS PRESCRIBED A CPAP MACHINE WHICH HE OCCASIONALLY USES. Work up also yielded renal insufficiency and hypotension. He was admitted for further eval.  Hospital Course:  SVT: likely related to dehydration from persistent diarrhea 2 days ago. Resolved in ED. Pt  vigorously hydrated with IV fluids. Monitored on tele without event during this hospitalization. Hx non-compliance with CPAP and some concern for enlarged atrium. 2decho complete yields EF 55-605 without wall motion abnormalities.  Hypotension: likely related to dehydration from persistent diarrhea. Responded quickly to IV fluids. Anti hypertensive meds held initially.  At discharge SBP range 119-155. At discharge will resume home meds. Has appointment with PCP 3 days. Recommend BP monitoring  Acute kidney injury: likely as a result of #2. Nephrotoxins held.  Creatinine improving at discharge. Baseline according to PCP 0.99. Holding metformin. Recommend bmet on 01/24/12 with PCP to evaluate renal function and determine if metformin to be resumed.   Anemia: hg 9.9. No information on baseline. Suspect related to chronic disease. Iron 22. FOBT pending. No s/sx active bleeding.  Dehydration: as result of persistent diarrhea 2 days ago. Improved with IV fluids.  At discharge taking po fluids well. Monitor  DM: Fair control holding home metformin due to #3. Not on insulin at home. A1c pending.  Will need follow up on 01/24/12 with PCP to determine if metformin can be resumed.  Obstructive sleep apnea: reports non-compliance with CPAP at home. Refused CPAP in hospital. Counseled on importance of CPAP and possible risks of not using. 2 decho with EF 55-60% and no wall abnormalities  Obesity: Nutritional consult   Procedures:  none  Consultations:  none  Discharge Exam: Filed Vitals:   01/20/12 2125 01/21/12 0607 01/21/12 0936 01/21/12 1353  BP: 152/72 119/64 136/58 126/68  Pulse: 82 70 84 78  Temp: 98.2 F (36.8 C) 97.8 F (36.6 C) 98 F (36.7 C) 98 F (  36.7 C)  TempSrc: Oral Oral Oral Oral  Resp: 20 20 20 20   Height: 5\' 8"  (1.727 m)     Weight: 99.8 kg (220 lb 0.3 oz)     SpO2: 100% 99% 98% 96%    General: awake alert NAD Cardiovascular: RRR No MGR No LEE PPP Respiratory: normal effort  BSCTAB no wheeze/rhochi  Discharge Instructions  Discharge Orders    Future Orders Please Complete By Expires   Diet - low sodium heart healthy      Increase activity slowly      Discharge instructions      Comments:   Has appointment PCP Friday 01/24/12 at 9am. Will need CBC, Bmet to evaluate renal function and anemia. Holding metformin until creatinine closer to baseline.   Call MD for:  temperature >100.4      Call MD for:  persistant dizziness or light-headedness      Call MD for:  extreme fatigue          Medication List     As of 01/21/2012  4:00 PM    STOP taking these medications         pioglitazone-metformin 15-850 MG per tablet   Commonly known as: ACTOPLUS MET      TAKE these medications         alum & mag hydroxide-simeth 200-200-20 MG/5ML suspension   Commonly known as: MAALOX/MYLANTA   Take 30 mLs by mouth every 6 (six) hours as needed (dyspepsia).      aspirin 81 MG tablet   Take 81 mg by mouth daily.      atorvastatin 10 MG tablet   Commonly known as: LIPITOR   Take 5 mg by mouth daily.      FLOMAX 0.4 MG Caps   Generic drug: Tamsulosin HCl   Take 0.4 mg by mouth daily.      irbesartan-hydrochlorothiazide 150-12.5 MG per tablet   Commonly known as: AVALIDE   Take 1 tablet by mouth 2 (two) times daily.           Follow-up Information    Follow up with DOUGH,ROBERT, MD. On 01/24/2012. (appointment at 9am. will need bmet and cbc for evaluation of renal function and anemia. will need to determine if metformin can be restarted. )    Contact information:   [address] [place] [address] [phone]           The results of significant diagnostics from this hospitalization (including imaging, microbiology, ancillary and laboratory) are listed below for reference.    Significant Diagnostic Studies: Dg Chest 2 View  01/20/2012  *RADIOLOGY REPORT*  Clinical Data: Hypotension and tachycardia  CHEST - 2 VIEW  Comparison: None  Findings: The  heart size is mildly enlarged.  The mediastinal contours are within normal limits.  Both lungs are clear.  The visualized skeletal structures are unremarkable.  IMPRESSION: No active cardiopulmonary abnormalities.   Original Report Authenticated By: Signa Kell, M.D.     Microbiology: No results found for this or any previous visit (from the past 240 hour(s)).   Labs: Basic Metabolic Panel:  Lab 01/21/12 2130 01/20/12 2201 01/20/12 1618  NA 137 -- 135  K 3.7 -- 4.1  CL 107 -- 102  CO2 22 -- 17*  GLUCOSE 134* -- 212*  BUN 40* -- 49*  CREATININE 1.62* 2.12* 2.35*  CALCIUM 8.5 -- 9.4  MG -- -- --  PHOS -- -- --   Liver Function Tests: No results found for this  basename: AST:5,ALT:5,ALKPHOS:5,BILITOT:5,PROT:5,ALBUMIN:5 in the last 168 hours No results found for this basename: LIPASE:5,AMYLASE:5 in the last 168 hours No results found for this basename: AMMONIA:5 in the last 168 hours CBC:  Lab 01/21/12 0700 01/20/12 2201 01/20/12 1618  WBC 4.5 4.6 8.2  NEUTROABS -- -- --  HGB 9.9* 10.2* 12.2*  HCT 29.3* 31.0* 37.0*  MCV 89.9 89.9 89.6  PLT 147* 161 192   Cardiac Enzymes: No results found for this basename: CKTOTAL:5,CKMB:5,CKMBINDEX:5,TROPONINI:5 in the last 168 hours BNP: BNP (last 3 results) No results found for this basename: PROBNP:3 in the last 8760 hours CBG:  Lab 01/21/12 1158 01/21/12 0832 01/20/12 2127  GLUCAP 153* 161* 157*       Signed:  Meilin Brosh M  [facility] 01/21/2012, 4:00 PM

## 2012-01-25 NOTE — ED Provider Notes (Signed)
I saw and evaluated the patient, reviewed the resident's note and I agree with the findings and plan.  71yM with palpitations. SVT on arrival which resolved w/o intervention. No hx of same. Cr 2.35 w/ BUN of 50. Likely pre-renal from recent diarrheal illness. Mild hypotension which improved with IVF. On exam, NAD. Alert and appropriate. Lungs clear w/o increased WOB. Mildly tachycardia. abd soft, nt,nd. Lower extremities symmetric as compared to each other. No calf tenderness. Negative Homan's. No palpable cords. Skin warm dry. Will admit for ARF/dehydration.   Raeford Razor, MD 01/25/12 [phone]

## 2012-01-29 NOTE — Discharge Summary (Signed)
Patient seen and examined by me.  Called PCP and arranged for close follow up.    Marlin Canary DO

## 2012-07-27 DIAGNOSIS — N402 Nodular prostate without lower urinary tract symptoms: Secondary | ICD-10-CM | POA: Insufficient documentation

## 2014-02-14 DIAGNOSIS — N138 Other obstructive and reflux uropathy: Secondary | ICD-10-CM

## 2014-02-14 DIAGNOSIS — N3941 Urge incontinence: Secondary | ICD-10-CM

## 2014-02-14 DIAGNOSIS — N401 Enlarged prostate with lower urinary tract symptoms: Secondary | ICD-10-CM

## 2014-02-14 HISTORY — DX: Other obstructive and reflux uropathy: N40.1

## 2014-02-14 HISTORY — DX: Urge incontinence: N39.41

## 2014-02-14 HISTORY — DX: Other obstructive and reflux uropathy: N13.8

## 2014-03-16 IMAGING — CR DG CHEST 2V
2 series · 2 of 2 positions shown · non-contrast
Comparison: None

CLINICAL DATA: Hypotension and tachycardia

CHEST - 2 VIEW

[w chest pa]
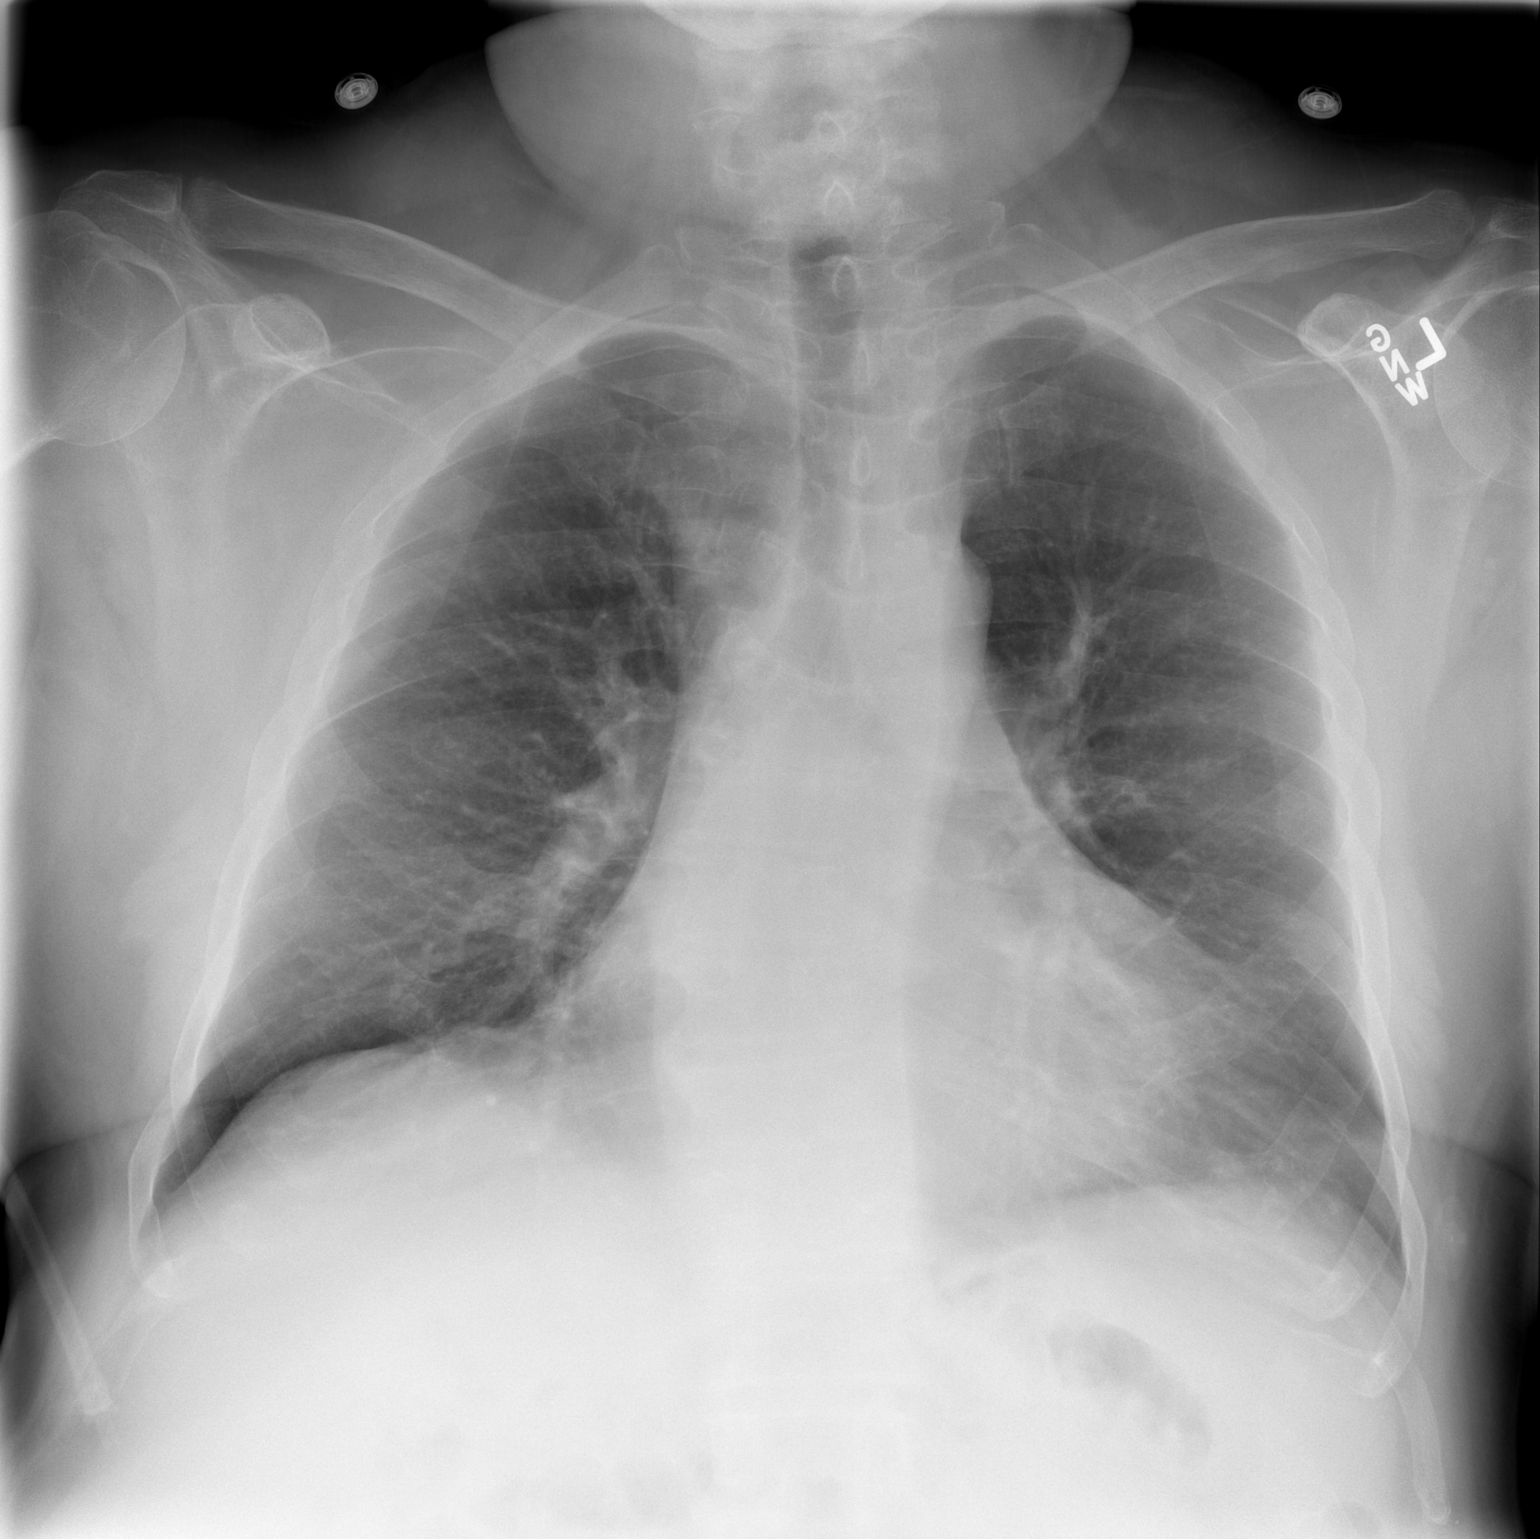

[w chest lat]
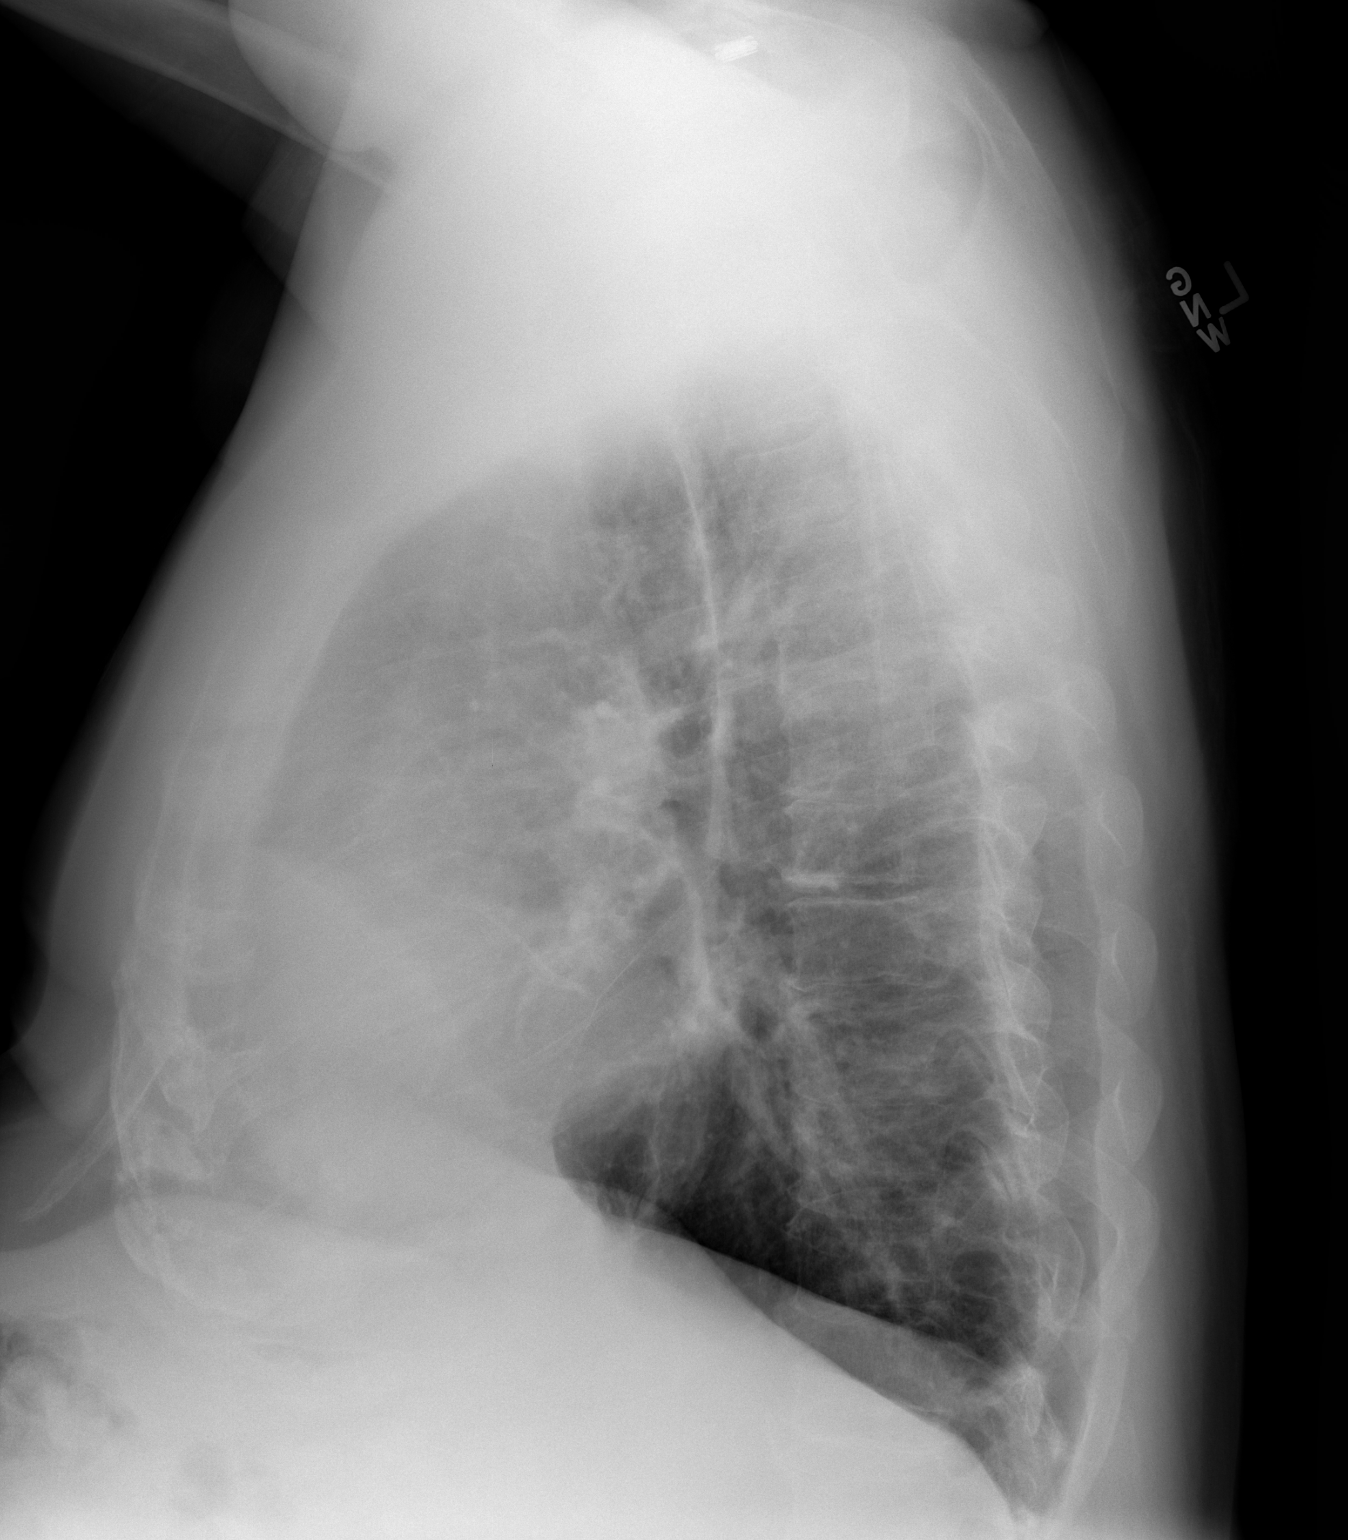

[2 of 2 positions shown; findings below may reference images not displayed]

FINDINGS: The heart size is mildly enlarged.  The mediastinal
contours are within normal limits.  Both lungs are clear.  The
visualized skeletal structures are unremarkable.
IMPRESSION: No active cardiopulmonary abnormalities.

## 2015-06-12 DIAGNOSIS — N183 Chronic kidney disease, stage 3 unspecified: Secondary | ICD-10-CM

## 2015-06-12 DIAGNOSIS — I482 Chronic atrial fibrillation, unspecified: Secondary | ICD-10-CM

## 2015-06-12 DIAGNOSIS — M353 Polymyalgia rheumatica: Secondary | ICD-10-CM | POA: Insufficient documentation

## 2015-06-12 DIAGNOSIS — D510 Vitamin B12 deficiency anemia due to intrinsic factor deficiency: Secondary | ICD-10-CM

## 2015-06-12 DIAGNOSIS — I10 Essential (primary) hypertension: Secondary | ICD-10-CM | POA: Insufficient documentation

## 2015-06-12 HISTORY — DX: Chronic atrial fibrillation, unspecified: I48.20

## 2015-06-12 HISTORY — DX: Chronic kidney disease, stage 3 unspecified: N18.30

## 2015-06-12 HISTORY — DX: Vitamin B12 deficiency anemia due to intrinsic factor deficiency: D51.0

## 2015-06-12 HISTORY — DX: Essential (primary) hypertension: I10

## 2015-06-13 DIAGNOSIS — Z8249 Family history of ischemic heart disease and other diseases of the circulatory system: Secondary | ICD-10-CM | POA: Insufficient documentation

## 2015-06-13 DIAGNOSIS — M5416 Radiculopathy, lumbar region: Secondary | ICD-10-CM

## 2015-06-13 HISTORY — DX: Radiculopathy, lumbar region: M54.16

## 2015-06-13 HISTORY — DX: Family history of ischemic heart disease and other diseases of the circulatory system: Z82.49

## 2015-12-10 DIAGNOSIS — M818 Other osteoporosis without current pathological fracture: Secondary | ICD-10-CM

## 2015-12-10 HISTORY — DX: Other osteoporosis without current pathological fracture: M81.8

## 2015-12-17 DIAGNOSIS — D126 Benign neoplasm of colon, unspecified: Secondary | ICD-10-CM | POA: Insufficient documentation

## 2015-12-17 HISTORY — DX: Benign neoplasm of colon, unspecified: D12.6

## 2016-06-16 DIAGNOSIS — E538 Deficiency of other specified B group vitamins: Secondary | ICD-10-CM | POA: Insufficient documentation

## 2018-08-27 DIAGNOSIS — I480 Paroxysmal atrial fibrillation: Secondary | ICD-10-CM | POA: Diagnosis not present

## 2018-08-27 DIAGNOSIS — R112 Nausea with vomiting, unspecified: Secondary | ICD-10-CM

## 2018-08-27 DIAGNOSIS — E119 Type 2 diabetes mellitus without complications: Secondary | ICD-10-CM

## 2018-08-27 DIAGNOSIS — R42 Dizziness and giddiness: Secondary | ICD-10-CM

## 2018-08-27 DIAGNOSIS — R1013 Epigastric pain: Secondary | ICD-10-CM | POA: Diagnosis not present

## 2018-08-27 DIAGNOSIS — D329 Benign neoplasm of meninges, unspecified: Secondary | ICD-10-CM

## 2018-08-28 DIAGNOSIS — R079 Chest pain, unspecified: Secondary | ICD-10-CM

## 2018-08-28 DIAGNOSIS — R112 Nausea with vomiting, unspecified: Secondary | ICD-10-CM | POA: Diagnosis not present

## 2018-08-28 DIAGNOSIS — I480 Paroxysmal atrial fibrillation: Secondary | ICD-10-CM | POA: Diagnosis not present

## 2018-08-28 DIAGNOSIS — R42 Dizziness and giddiness: Secondary | ICD-10-CM | POA: Diagnosis not present

## 2018-08-31 DIAGNOSIS — R42 Dizziness and giddiness: Secondary | ICD-10-CM | POA: Insufficient documentation

## 2018-08-31 HISTORY — DX: Dizziness and giddiness: R42

## 2018-09-01 DIAGNOSIS — D329 Benign neoplasm of meninges, unspecified: Secondary | ICD-10-CM

## 2018-09-01 HISTORY — DX: Benign neoplasm of meninges, unspecified: D32.9

## 2019-01-05 ENCOUNTER — Ambulatory Visit: Payer: Medicare HMO | Admitting: Cardiology

## 2019-01-22 ENCOUNTER — Other Ambulatory Visit: Payer: Self-pay

## 2019-01-22 ENCOUNTER — Ambulatory Visit (INDEPENDENT_AMBULATORY_CARE_PROVIDER_SITE_OTHER): Payer: Medicare HMO

## 2019-01-22 ENCOUNTER — Ambulatory Visit (INDEPENDENT_AMBULATORY_CARE_PROVIDER_SITE_OTHER): Payer: Medicare HMO | Admitting: Cardiology

## 2019-01-22 ENCOUNTER — Encounter: Payer: Self-pay | Admitting: Cardiology

## 2019-01-22 VITALS — BP 120/60 | HR 75 | Ht 67.5 in | Wt 202.0 lb

## 2019-01-22 DIAGNOSIS — D329 Benign neoplasm of meninges, unspecified: Secondary | ICD-10-CM | POA: Diagnosis not present

## 2019-01-22 DIAGNOSIS — I480 Paroxysmal atrial fibrillation: Secondary | ICD-10-CM

## 2019-01-22 DIAGNOSIS — I119 Hypertensive heart disease without heart failure: Secondary | ICD-10-CM

## 2019-01-22 DIAGNOSIS — E782 Mixed hyperlipidemia: Secondary | ICD-10-CM | POA: Diagnosis not present

## 2019-01-22 DIAGNOSIS — I482 Chronic atrial fibrillation, unspecified: Secondary | ICD-10-CM | POA: Insufficient documentation

## 2019-01-22 MED ORDER — FLECAINIDE ACETATE 50 MG PO TABS: 50.0000 mg | ORAL_TABLET | Freq: Two times a day (BID) | ORAL | 3 refills | Status: DC

## 2019-01-22 MED ORDER — METOPROLOL SUCCINATE ER 25 MG PO TB24: 25.0000 mg | ORAL_TABLET | Freq: Every day | ORAL | 3 refills | Status: DC

## 2019-01-22 NOTE — Progress Notes (Signed)
Cardiology Office Note:    Date:  01/22/2019   ID:  Wayne [name], DOB [birth year], MRN [id number]  PCP:  Wayne Greenhouse, MD  Cardiologist:  Wayne More, MD    Referring MD: Wayne Greenhouse, MD    ASSESSMENT:    1. Paroxysmal atrial fibrillation (HCC)   2. Hypertensive heart disease without heart failure   3. Mixed hyperlipidemia   4. Meningioma ([place])    PLAN:    In order of problems listed above:  1. Deep research I found an EKG from 2013 of rapid atrial fibrillation in the epic system.  He is having recurrent clinical episodes we discussed evaluation treatment and decided to start him on antiarrhythmic drug flecainide 50 mg daily continue anticoagulation 2-week ZIO monitor and reassess in the office.  If he fails drug therapy referral for EP catheter ablation is appropriate.  He is concerned his episode of dizziness are related and I think that it is due to his his carvedilol and he tells me an hour or 2 after taking becomes quite lightheaded will discontinue the drug and switch him to metoprolol which does not have an alpha-blocker activity. 2. Stable BP at target continue current treatment switch beta-blockers 3. Stable continue statin 4. Refer to neurology Wayne [name] I think the patient may be correct that some of his disequilibrium and nausea could be related to meningioma although could be an incidental finding and certainly I think he deserves evaluation with ongoing anticoagulation.   Next appointment: 1 week in my office he will need an EKG at that time   Medication Adjustments/Labs and Tests Ordered: Current medicines are reviewed at length with the patient today.  Concerns regarding medicines are outlined above.  Orders Placed This Encounter  Procedures  . Ambulatory referral to Neurology  . LONG TERM MONITOR (3-14 DAYS)  . EKG 12-Lead   Meds ordered this encounter  Medications  . metoprolol succinate (TOPROL-XL) 25 MG 24 hr tablet    Sig: Take 1  tablet (25 mg total) by mouth daily.    Dispense:  30 tablet    Refill:  3  . flecainide (TAMBOCOR) 50 MG tablet    Sig: Take 1 tablet (50 mg total) by mouth 2 (two) times daily.    Dispense:  60 tablet    Refill:  3    No chief complaint on file.   History of Present Illness:    Wayne [name] is a 78 y.o. male with a hx of atrial fibrillation hypertension obstructive sleep apnea diabetes and hyperlipidemia.  Note from Wayne [name] 2015 says he has been seen in [facility] emergency room for atrial fibrillation 12/06/2013 was found to have atrial fibrillation was admitted to the hospital placed on beta-blocker and oral anticoagulant.  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  He was seen in the emergency room at that time for complaints of dizzy, nausea and vomiting.  CBC was normal creatinine 1.20 BNP level 160 troponin was assessed and found to be normal and COVID-19 test was normal.  EKG was described as sinus rhythm and normal.  Chest x-ray in the emergency room was normal.  The next day he underwent a Lexiscan myocardial perfusion study ejection fraction 63% normal perfusion and normal function.  He was admitted to the hospital and surprisingly his discharge  diagnosis is chest pain although the symptoms were nausea vomiting and dizziness and was discharged the next day from the hospital.  I independently reviewed his EKG which showed sinus rhythm marked first-degree AV block PR interval 314 ms otherwise normal.  Compliance with diet, lifestyle and medications: Yes  He has several concerns the first is he is concerned about meningioma request referral to see a neurologist and wonders if it is related to his episodes of dizziness nausea and vomiting.  His second concern is having recurrent episodes of rapid irregular heart rhythm and  hypotension about 1 time a month and inquires about alternative treatments for his atrial fibrillation.  We will place him on flecainide as an outpatient applied 2-week ambulatory heart rhythm monitor and if we cannot control episodes consider referral for EP catheter ablation.  His third concern is he is just left somewhat confused about his hospitalization and clearly this event was unrelated to atrial fibrillation.  When not in atrial fibrillation he has no exercise intolerance dyspnea shortness of breath chest pain palpitation.  He tolerates his anticoagulant and has had no bleeding complication.  He has no known history of coronary artery disease Past Medical History:  Diagnosis Date  . Arrhythmia   . DM (diabetes mellitus) ([place])   . HTN (hypertension)   . OSA (obstructive sleep apnea)   . Palpitations   . PVC's (premature ventricular contractions)     Past Surgical History:  Procedure Laterality Date  . NO PAST SURGERIES      Current Medications: Current Meds  Medication Sig  . alendronate (FOSAMAX) 70 MG tablet TAKE 1 TABLET BY MOUTH  EVERY 7 DAYS (BONE  STRENGTH)  . atorvastatin (LIPITOR) 10 MG tablet Take 10 mg by mouth daily.   . Calcium Citrate-Vitamin D 315-250 MG-UNIT TABS Take 2 tablets by mouth 2 (two) times daily.  . cyanocobalamin (,VITAMIN B-12,) 1000 MCG/ML injection INJECT 1 ML IN THE MUSCLE EVERY 30 DAYS  . Dulaglutide (TRULICITY) 1.5 0000000 SOPN Inject 1.5 mg into the skin once a week.  Marland Kitchen ELIQUIS 5 MG TABS tablet Take 5 mg by mouth 2 (two) times daily.  . INVOKANA 300 MG TABS tablet Take 300 mg by mouth daily.  . irbesartan (AVAPRO) 75 MG tablet Take 75 mg by mouth daily.  . metFORMIN (GLUCOPHAGE) 500 MG tablet Take 500 mg by mouth 2 (two) times daily.  . solifenacin (VESICARE) 10 MG tablet Take 10 mg by mouth daily.  . Tamsulosin HCl (FLOMAX) 0.4 MG CAPS Take 0.4 mg by mouth daily.    . [DISCONTINUED] carvedilol (COREG) 25 MG tablet Take 0.5 tablets by mouth 2  (two) times daily.     Allergies:   Patient has no known allergies.   Social History   Socioeconomic History  . Marital status: Single    Spouse name: Not on file  . Number of children: Not on file  . Years of education: Not on file  . Highest education level: Not on file  Occupational History  . Occupation: retired Scientist, water quality  . Financial resource strain: Not on file  . Food insecurity    Worry: Not on file    Inability: Not on file  . Transportation needs    Medical: Not on file    Non-medical: Not on file  Tobacco Use  . Smoking status: Never Smoker  . Smokeless tobacco: Never Used  Substance and Sexual Activity  . Alcohol use: Yes    Alcohol/week: 1.0 -  2.0 standard drinks    Types: 1 - 2 Glasses of wine per week    Comment: daily  . Drug use: Never  . Sexual activity: Not on file  Lifestyle  . Physical activity    Days per week: Not on file    Minutes per session: Not on file  . Stress: Not on file  Relationships  . Social Herbalist on phone: Not on file    Gets together: Not on file    Attends religious service: Not on file    Active member of club or organization: Not on file    Attends meetings of clubs or organizations: Not on file    Relationship status: Not on file  Other Topics Concern  . Not on file  Social History Narrative  . Not on file     Family History: The patient's family history includes Diabetes in his brother; Heart disease in his father; Kidney disease in his mother. ROS:  Review of Systems  Constitution: Negative.  HENT: Negative.   Eyes: Negative.   Cardiovascular: Positive for palpitations.  Respiratory: Negative.   Endocrine: Negative.   Hematologic/Lymphatic: Negative.   Skin: Negative.   Musculoskeletal: Negative.   Gastrointestinal: Negative.   Genitourinary: Negative.   Neurological: Positive for dizziness.  Psychiatric/Behavioral: Negative.   Allergic/Immunologic: Negative.    Please see  the history of present illness.    All other systems reviewed and are negative.  EKGs/Labs/Other Studies Reviewed:    The following studies were reviewed today:  EKG:  EKG ordered today and personally reviewed.  The ekg ordered today demonstrates sinus rhythm marked first-degree AV block  Recent Labs: 12/31/2018 hemoglobin 13.7 platelets 173,000 creatinine 1.25 GFR 54 cc cholesterol 80 LDL 40 HDL 37 triglycerides 70 No results found for requested labs within last 8760 hours.  Recent Lipid Panel No results found for: CHOL, TRIG, HDL, CHOLHDL, VLDL, LDLCALC, LDLDIRECT  Physical Exam:    VS:  BP 120/60 (BP Location: Left Arm, Patient Position: Sitting, Cuff Size: Normal)   Pulse 75   Ht 5' 7.5" (1.715 m)   Wt 202 lb (91.6 kg)   SpO2 98%   BMI 31.17 kg/m     Wt Readings from Last 3 Encounters:  01/22/19 202 lb (91.6 kg)  01/20/12 220 lb 0.3 oz (99.8 kg)     GEN:  Well nourished, well developed in no acute distress HEENT: Normal NECK: No JVD; No carotid bruits LYMPHATICS: No lymphadenopathy CARDIAC: RRR, no murmurs, rubs, gallops RESPIRATORY:  Clear to auscultation without rales, wheezing or rhonchi  ABDOMEN: Soft, non-tender, non-distended MUSCULOSKELETAL:  No edema; No deformity  SKIN: Warm and dry NEUROLOGIC:  Alert and oriented x 3 PSYCHIATRIC:  Normal affect    Signed, Wayne More, MD  01/22/2019 2:48 PM    [place]

## 2019-01-22 NOTE — Patient Instructions (Signed)
Medication Instructions:  Your physician has recommended you make the following change in your medication:  STOP carvedilol (coreg)   START metoprolol succinate (toprol-XL) 25 mg: Take 1 tablet daily  START flecainide (tambocor) 50 mg: Take 1 tablet twice daily  *If you need a refill on your cardiac medications before your next appointment, please call your pharmacy*  Lab Work: None  If you have labs (blood work) drawn today and your tests are completely normal, you will receive your results only by: Marland Kitchen MyChart Message (if you have MyChart) OR . A paper copy in the mail If you have any lab test that is abnormal or we need to change your treatment, we will call you to review the results.  Testing/Procedures: You had an EKG today.  Your physician has recommended that you wear a ZIO monitor. ZIO monitors are medical devices that record the heart's electrical activity. Doctors most often use these monitors to diagnose arrhythmias. Arrhythmias are problems with the speed or rhythm of the heartbeat. The monitor is a small, portable device. You can wear one while you do your normal daily activities. This is usually used to diagnose what is causing palpitations/syncope (passing out). Wear for 14 days.   You have been referred to see a neurologist, Dr. Felecia Shelling. You will be contacted to schedule this appointment.   Follow-Up: At [facility], you and your health needs are our priority.  As part of our continuing mission to provide you with exceptional heart care, we have created designated Provider Care Teams.  These Care Teams include your primary Cardiologist (physician) and Advanced Practice Providers (APPs -  Physician Assistants and Nurse Practitioners) who all work together to provide you with the care you need, when you need it.  Your next appointment:   1 week(s)  The format for your next appointment:   In Person  Provider:   Shirlee More, MD or Laurann Montana, FNP [facility]  Office)    Metoprolol extended-release tablets What is this medicine? METOPROLOL (me TOE proe lole) is a beta-blocker. Beta-blockers reduce the workload on the heart and help it to beat more regularly. This medicine is used to treat high blood pressure and to prevent chest pain. It is also used to after a heart attack and to prevent an additional heart attack from occurring. This medicine may be used for other purposes; ask your health care provider or pharmacist if you have questions. COMMON BRAND NAME(S): toprol, Toprol XL What should I tell my health care provider before I take this medicine? They need to know if you have any of these conditions:  diabetes  heart or vessel disease like slow heart rate, worsening heart failure, heart block, sick sinus syndrome or Raynaud's disease  kidney disease  liver disease  lung or breathing disease, like asthma or emphysema  pheochromocytoma  thyroid disease  an unusual or allergic reaction to metoprolol, other beta-blockers, medicines, foods, dyes, or preservatives  pregnant or trying to get pregnant  breast-feeding How should I use this medicine? Take this medicine by mouth with a glass of water. Follow the directions on the prescription label. Do not crush or chew. Take this medicine with or immediately after meals. Take your doses at regular intervals. Do not take more medicine than directed. Do not stop taking this medicine suddenly. This could lead to serious heart-related effects. Talk to your pediatrician regarding the use of this medicine in children. While this drug may be prescribed for children as young as 6  years for selected conditions, precautions do apply. Overdosage: If you think you have taken too much of this medicine contact a poison control center or emergency room at once. NOTE: This medicine is only for you. Do not share this medicine with others. What if I miss a dose? If you miss a dose, take it as soon as you  can. If it is almost time for your next dose, take only that dose. Do not take double or extra doses. What may interact with this medicine? This medicine may interact with the following medications:  certain medicines for blood pressure, heart disease, irregular heart beat  certain medicines for depression, like monoamine oxidase (MAO) inhibitors, fluoxetine, or paroxetine  clonidine  dobutamine  epinephrine  isoproterenol  reserpine This list may not describe all possible interactions. Give your health care provider a list of all the medicines, herbs, non-prescription drugs, or dietary supplements you use. Also tell them if you smoke, drink alcohol, or use illegal drugs. Some items may interact with your medicine. What should I watch for while using this medicine? Visit your doctor or health care professional for regular check ups. Contact your doctor right away if your symptoms worsen. Check your blood pressure and pulse rate regularly. Ask your health care professional what your blood pressure and pulse rate should be, and when you should contact them. You may get drowsy or dizzy. Do not drive, use machinery, or do anything that needs mental alertness until you know how this medicine affects you. Do not sit or stand up quickly, especially if you are an older patient. This reduces the risk of dizzy or fainting spells. Contact your doctor if these symptoms continue. Alcohol may interfere with the effect of this medicine. Avoid alcoholic drinks. This medicine may increase blood sugar. Ask your healthcare provider if changes in diet or medicines are needed if you have diabetes. What side effects may I notice from receiving this medicine? Side effects that you should report to your doctor or health care professional as soon as possible:  allergic reactions like skin rash, itching or hives  cold or numb hands or feet  depression  difficulty breathing  faint  fever with sore  throat  irregular heartbeat, chest pain  rapid weight gain   signs and symptoms of high blood sugar such as being more thirsty or hungry or having to urinate more than normal. You may also feel very tired or have blurry vision.  swollen legs or ankles Side effects that usually do not require medical attention (report to your doctor or health care professional if they continue or are bothersome):  anxiety or nervousness  change in sex drive or performance  dry skin  headache  nightmares or trouble sleeping  short term memory loss  stomach upset or diarrhea This list may not describe all possible side effects. Call your doctor for medical advice about side effects. You may report side effects to FDA at 1-800-FDA-1088. Where should I keep my medicine? Keep out of the reach of children. Store at room temperature between 15 and 30 degrees C (59 and 86 degrees F). Throw away any unused medicine after the expiration date. NOTE: This sheet is a summary. It may not cover all possible information. If you have questions about this medicine, talk to your doctor, pharmacist, or health care provider.  2020 Elsevier/Gold Standard (2017-11-25 11:09:41)    Flecainide tablets What is this medicine? FLECAINIDE (FLEK a nide) is an antiarrhythmic drug. This medicine is used  to prevent irregular heart rhythm. It can also slow down fast heartbeats called tachycardia. This medicine may be used for other purposes; ask your health care provider or pharmacist if you have questions. COMMON BRAND NAME(S): Tambocor What should I tell my health care provider before I take this medicine? They need to know if you have any of these conditions:  abnormal levels of potassium in the blood  heart disease including heart rhythm and heart rate problems  kidney or liver disease  recent heart attack  an unusual or allergic reaction to flecainide, local anesthetics, other medicines, foods, dyes, or  preservatives  pregnant or trying to get pregnant  breast-feeding How should I use this medicine? Take this medicine by mouth with a glass of water. Follow the directions on the prescription label. You can take this medicine with or without food. Take your doses at regular intervals. Do not take your medicine more often than directed. Do not stop taking this medicine suddenly. This may cause serious, heart-related side effects. If your doctor wants you to stop the medicine, the dose may be slowly lowered over time to avoid any side effects. Talk to your pediatrician regarding the use of this medicine in children. While this drug may be prescribed for children as young as 1 year of age for selected conditions, precautions do apply. Overdosage: If you think you have taken too much of this medicine contact a poison control center or emergency room at once. NOTE: This medicine is only for you. Do not share this medicine with others. What if I miss a dose? If you miss a dose, take it as soon as you can. If it is almost time for your next dose, take only that dose. Do not take double or extra doses. What may interact with this medicine? Do not take this medicine with any of the following medications:  amoxapine  arsenic trioxide  certain antibiotics like clarithromycin, erythromycin, gatifloxacin, gemifloxacin, levofloxacin, moxifloxacin, sparfloxacin, or troleandomycin  certain antidepressants called tricyclic antidepressants like amitriptyline, imipramine, or nortriptyline  certain medicines to control heart rhythm like disopyramide, encainide, moricizine, procainamide, propafenone, and quinidine  cisapride  delavirdine  droperidol  haloperidol  haw[place]orn  imatinib  levomethadyl  maprotiline  medicines for malaria like chloroquine and halofantrine  pentamidine  phenothiazines like chlorpromazine, mesoridazine, prochlorperazine,  thioridazine  pimozide  quinine  ranolazine  ritonavir  sertindole This medicine may also interact with the following medications:  cimetidine  dofetilide  medicines for angina or high blood pressure  medicines to control heart rhythm like amiodarone and digoxin  ziprasidone This list may not describe all possible interactions. Give your health care provider a list of all the medicines, herbs, non-prescription drugs, or dietary supplements you use. Also tell them if you smoke, drink alcohol, or use illegal drugs. Some items may interact with your medicine. What should I watch for while using this medicine? Visit your doctor or health care professional for regular checks on your progress. Because your condition and the use of this medicine carries some risk, it is a good idea to carry an identification card, necklace or bracelet with details of your condition, medications and doctor or health care professional. Check your blood pressure and pulse rate regularly. Ask your health care professional what your blood pressure and pulse rate should be, and when you should contact him or her. Your doctor or health care professional also may schedule regular blood tests and electrocardiograms to check your progress. You may get drowsy or  dizzy. Do not drive, use machinery, or do anything that needs mental alertness until you know how this medicine affects you. Do not stand or sit up quickly, especially if you are an older patient. This reduces the risk of dizzy or fainting spells. Alcohol can make you more dizzy, increase flushing and rapid heartbeats. Avoid alcoholic drinks. What side effects may I notice from receiving this medicine? Side effects that you should report to your doctor or health care professional as soon as possible:  chest pain, continued irregular heartbeats  difficulty breathing  swelling of the legs or feet  trembling, shaking  unusually weak or tired Side effects  that usually do not require medical attention (report to your doctor or health care professional if they continue or are bothersome):  blurred vision  constipation  headache  nausea, vomiting  stomach pain This list may not describe all possible side effects. Call your doctor for medical advice about side effects. You may report side effects to FDA at 1-800-FDA-1088. Where should I keep my medicine? Keep out of the reach of children. Store at room temperature between 15 and 30 degrees C (59 and 86 degrees F). Protect from light. Keep container tightly closed. Throw away any unused medicine after the expiration date. NOTE: This sheet is a summary. It may not cover all possible information. If you have questions about this medicine, talk to your doctor, pharmacist, or health care provider.  2020 Elsevier/Gold Standard (2018-01-26 11:41:38)

## 2019-01-29 ENCOUNTER — Other Ambulatory Visit: Payer: Self-pay

## 2019-01-29 ENCOUNTER — Encounter: Payer: Self-pay | Admitting: Cardiology

## 2019-01-29 ENCOUNTER — Ambulatory Visit (INDEPENDENT_AMBULATORY_CARE_PROVIDER_SITE_OTHER): Payer: Medicare HMO | Admitting: Cardiology

## 2019-01-29 VITALS — BP 118/56 | HR 68 | Ht 67.0 in | Wt 204.0 lb

## 2019-01-29 DIAGNOSIS — I471 Supraventricular tachycardia: Secondary | ICD-10-CM

## 2019-01-29 DIAGNOSIS — I480 Paroxysmal atrial fibrillation: Secondary | ICD-10-CM

## 2019-01-29 HISTORY — DX: Paroxysmal atrial fibrillation: I48.0

## 2019-01-29 NOTE — Addendum Note (Signed)
Addended by: Ashok Norris on: 01/29/2019 02:13 PM   Modules accepted: Orders

## 2019-01-29 NOTE — Progress Notes (Signed)
Cardiology Office Note:    Date:  01/29/2019   ID:  Wayne [name], DOB 1940/04/13, MRN [id number]  PCP:  Algis Greenhouse, MD  Cardiologist:  Jenean Lindau, MD   Referring MD: Algis Greenhouse, MD    ASSESSMENT:    1. SVT (supraventricular tachycardia) (HCC)   2. Paroxysmal atrial fibrillation (HCC)    PLAN:    In order of problems listed above:  1. Paroxysmal atrial fibrillation:I discussed with the patient atrial fibrillation, disease process. Management and therapy including rate and rhythm control, anticoagulation benefits and potential risks were discussed extensively with the patient. Patient had multiple questions which were answered to patient's satisfaction. 2. Essential hypertension: Blood pressure stable advised the patient to keep himself well-hydrated 3. Mixed dyslipidemia and diabetes mellitus: Diet was discussed importance of regular exercise stressed and weight reduction was stressed.  Risks of obesity explained and need understands.  He is going to do his best and do better. 4. EKG report was discussed with him at length.  Because of his complex medications he will have a Chem-7 today.  Follow-up appointment in a month or earlier if he has any concerns.  Sleep health issues were discussed.   Medication Adjustments/Labs and Tests Ordered: Current medicines are reviewed at length with the patient today.  Concerns regarding medicines are outlined above.  No orders of the defined types were placed in this encounter.  No orders of the defined types were placed in this encounter.    Chief Complaint  Patient presents with  . Follow-up     History of Present Illness:    Wayne [name] is a 78 y.o. male.  Patient has past medical history of essential hypertension, dyslipidemia, diabetes mellitus and obesity.  He denies any problems at this time and takes care of activities of daily living.  He has seen my partner Dr. Bettina Gavia recently.  He was in the hospital and  was evaluated.  He underwent stress test according to patient's information and he was told that it was negative.  Subsequently has been on flecainide.  His carvedilol was switched to metoprolol and is tolerating it well.  No chest pain orthopnea PND.  No dizzy spells or any passing out spells.  He has a meningioma and Dr. Bettina Gavia advised him to see a neurologist and he is working on it.  Past Medical History:  Diagnosis Date  . Arrhythmia   . DM (diabetes mellitus) ([place])   . HTN (hypertension)   . OSA (obstructive sleep apnea)   . Palpitations   . PVC's (premature ventricular contractions)     Past Surgical History:  Procedure Laterality Date  . NO PAST SURGERIES      Current Medications: Current Meds  Medication Sig  . alendronate (FOSAMAX) 70 MG tablet TAKE 1 TABLET BY MOUTH  EVERY 7 DAYS (BONE  STRENGTH)  . atorvastatin (LIPITOR) 10 MG tablet Take 10 mg by mouth daily.   . Calcium Citrate-Vitamin D 315-250 MG-UNIT TABS Take 2 tablets by mouth 2 (two) times daily.  . cyanocobalamin (,VITAMIN B-12,) 1000 MCG/ML injection INJECT 1 ML IN THE MUSCLE EVERY 30 DAYS  . Dulaglutide (TRULICITY) 1.5 0000000 SOPN Inject 1.5 mg into the skin once a week.  Marland Kitchen ELIQUIS 5 MG TABS tablet Take 5 mg by mouth 2 (two) times daily.  . flecainide (TAMBOCOR) 50 MG tablet Take 1 tablet (50 mg total) by mouth 2 (two) times daily.  . INVOKANA 300 MG TABS tablet Take 300 mg by  mouth daily.  . irbesartan (AVAPRO) 75 MG tablet Take 75 mg by mouth daily.  . metFORMIN (GLUCOPHAGE) 500 MG tablet Take 500 mg by mouth 2 (two) times daily.  . metoprolol succinate (TOPROL-XL) 25 MG 24 hr tablet Take 1 tablet (25 mg total) by mouth daily.  . solifenacin (VESICARE) 10 MG tablet Take 10 mg by mouth daily.  . Tamsulosin HCl (FLOMAX) 0.4 MG CAPS Take 0.4 mg by mouth daily.       Allergies:   Patient has no known allergies.   Social History   Socioeconomic History  . Marital status: Single    Spouse name: Not on  file  . Number of children: Not on file  . Years of education: Not on file  . Highest education level: Not on file  Occupational History  . Occupation: retired Airline pilot  Tobacco Use  . Smoking status: Never Smoker  . Smokeless tobacco: Never Used  Substance and Sexual Activity  . Alcohol use: Yes    Alcohol/week: 1.0 - 2.0 standard drinks    Types: 1 - 2 Glasses of wine per week    Comment: daily  . Drug use: Never  . Sexual activity: Not on file  Other Topics Concern  . Not on file  Social History Narrative  . Not on file   Social Determinants of Health   Financial Resource Strain:   . Difficulty of Paying Living Expenses: Not on file  Food Insecurity:   . Worried About Charity fundraiser in the Last Year: Not on file  . Ran Out of Food in the Last Year: Not on file  Transportation Needs:   . Lack of Transportation (Medical): Not on file  . Lack of Transportation (Non-Medical): Not on file  Physical Activity:   . Days of Exercise per Week: Not on file  . Minutes of Exercise per Session: Not on file  Stress:   . Feeling of Stress : Not on file  Social Connections:   . Frequency of Communication with Friends and Family: Not on file  . Frequency of Social Gatherings with Friends and Family: Not on file  . Attends Religious Services: Not on file  . Active Member of Clubs or Organizations: Not on file  . Attends Archivist Meetings: Not on file  . Marital Status: Not on file     Family History: The patient's family history includes Diabetes in his brother; Heart disease in his father; Kidney disease in his mother.  ROS:   Please see the history of present illness.    All other systems reviewed and are negative.  EKGs/Labs/Other Studies Reviewed:    The following studies were reviewed today: EKG reveals sinus rhythm first-degree AV block nonspecific ST-T changes QRS interval was within normal limits   Recent Labs: No results found for requested labs  within last 8760 hours.  Recent Lipid Panel No results found for: CHOL, TRIG, HDL, CHOLHDL, VLDL, LDLCALC, LDLDIRECT  Physical Exam:    VS:  BP (!) 118/56 (BP Location: Left Arm, Patient Position: Sitting)   Pulse 68   Ht 5\' 7"  (1.702 m)   Wt 204 lb (92.5 kg)   SpO2 98%   BMI 31.95 kg/m     Wt Readings from Last 3 Encounters:  01/29/19 204 lb (92.5 kg)  01/22/19 202 lb (91.6 kg)  01/20/12 220 lb 0.3 oz (99.8 kg)     GEN: Patient is in no acute distress HEENT: Normal NECK: No JVD; No  carotid bruits LYMPHATICS: No lymphadenopathy CARDIAC: Hear sounds regular, 2/6 systolic murmur at the apex. RESPIRATORY:  Clear to auscultation without rales, wheezing or rhonchi  ABDOMEN: Soft, non-tender, non-distended MUSCULOSKELETAL:  No edema; No deformity  SKIN: Warm and dry NEUROLOGIC:  Alert and oriented x 3 PSYCHIATRIC:  Normal affect   Signed, Jenean Lindau, MD  01/29/2019 2:08 PM     [facility]

## 2019-01-29 NOTE — Patient Instructions (Signed)
Medication Instructions:  Your physician recommends that you continue on your current medications as directed. Please refer to the Current Medication list given to you today.  *If you need a refill on your cardiac medications before your next appointment, please call your pharmacy*  Lab Work: Your physician recommends that you return for lab work today: bmp   If you have labs (blood work) drawn today and your tests are completely normal, you will receive your results only by: Marland Kitchen MyChart Message (if you have MyChart) OR . A paper copy in the mail If you have any lab test that is abnormal or we need to change your treatment, we will call you to review the results.  Testing/Procedures: None.   Follow-Up: At [facility], you and your health needs are our priority.  As part of our continuing mission to provide you with exceptional heart care, we have created designated Provider Care Teams.  These Care Teams include your primary Cardiologist (physician) and Advanced Practice Providers (APPs -  Physician Assistants and Nurse Practitioners) who all work together to provide you with the care you need, when you need it.  Your next appointment:   1 month(s)  The format for your next appointment:   In Person  Provider:   Jyl Heinz, MD  Other Instructions

## 2019-01-29 NOTE — Addendum Note (Signed)
Addended by: Ashok Norris on: 01/29/2019 02:17 PM   Modules accepted: Orders

## 2019-01-30 LAB — BASIC METABOLIC PANEL
BUN/Creatinine Ratio: 19 (ref 10–24)
BUN: 23 mg/dL (ref 8–27)
CO2: 20 mmol/L (ref 20–29)
Calcium: 9.2 mg/dL (ref 8.6–10.2)
Chloride: 105 mmol/L (ref 96–106)
Creatinine, Ser: 1.24 mg/dL (ref 0.76–1.27)
GFR calc Af Amer: 63 mL/min/{1.73_m2} (ref >59)
GFR calc non Af Amer: 55 mL/min/{1.73_m2} — ABNORMAL LOW (ref >59)
Glucose: 155 mg/dL — ABNORMAL HIGH (ref 65–99)
Potassium: 4.8 mmol/L (ref 3.5–5.2)
Sodium: 140 mmol/L (ref 134–144)

## 2019-02-05 DIAGNOSIS — I480 Paroxysmal atrial fibrillation: Secondary | ICD-10-CM

## 2019-02-05 DIAGNOSIS — I119 Hypertensive heart disease without heart failure: Secondary | ICD-10-CM

## 2019-02-09 ENCOUNTER — Telehealth: Payer: Self-pay | Admitting: *Deleted

## 2019-02-09 NOTE — Telephone Encounter (Signed)
Telephone call to patient. Spoke with Wayne [name]. Informed of lab results.

## 2019-02-23 ENCOUNTER — Ambulatory Visit: Payer: Medicare HMO | Admitting: Cardiology

## 2019-02-25 ENCOUNTER — Telehealth: Payer: Self-pay

## 2019-02-25 NOTE — Telephone Encounter (Signed)
error 

## 2019-02-26 ENCOUNTER — Ambulatory Visit: Payer: Medicare HMO | Admitting: Neurology

## 2019-03-14 NOTE — Progress Notes (Signed)
Cardiology Office Note:    Date:  03/15/2019   ID:  Wayne [name], DOB 1940/04/21, MRN [id number]  PCP:  Wayne Greenhouse, MD  Cardiologist:  Wayne More, MD    Referring MD: Wayne Greenhouse, MD    ASSESSMENT:    1. Paroxysmal atrial fibrillation (HCC)   2. Meningioma ([place])   3. Hypertensive heart disease without heart failure   4. First degree AV block   5. High risk medication use    PLAN:    In order of problems listed above:  1. He has had a good response to flecainide his rhythm is stable we will check a level today continue that along with low-dose beta-blocker and for now avoid anticoagulation awaiting neurology evaluation for meningioma. 2. Stable hypertension BP at target continue treatment including ARB 3. Stable on recent EKG 4. Check flecainide level   Next appointment: 6 months   Medication Adjustments/Labs and Tests Ordered: Current medicines are reviewed at length with the patient today.  Concerns regarding medicines are outlined above.  No orders of the defined types were placed in this encounter.  No orders of the defined types were placed in this encounter.   Chief Complaint  Patient presents with  . Follow-up    For SVT initiated on flecainide    History of Present Illness:     Wayne [name] is a 79 y.o. 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  He was seen in the emergency room at that time for complaints of dizzy, nausea and vomiting.  CBC was normal creatinine 1.20 BNP level 160 troponin was assessed and found to be normal and COVID-19 test was normal.  EKG was described as sinus rhythm and normal.  Chest x-ray in the emergency room was normal.  The next day he underwent a Lexiscan myocardial  perfusion study ejection fraction 63% normal perfusion and normal function.  He was admitted to the hospital and surprisingly his discharge diagnosis is chest pain although the symptoms were nausea vomiting and dizziness and was discharged the next day from the hospital.  I independently reviewed his EKG which showed sinus rhythm marked first-degree AV block PR interval 314 ms otherwise normal.   Compliance with diet, lifestyle and medications: Yes  He is pleased he has had no breakthrough arrhythmia and his monitor showed suppression of his atrial arrhythmia.  No side effects from the flecainide and he is can have a neurology consultation tomorrow with his meningioma.  No chest pain shortness of breath palpitation or syncope he is not having headache gait disturbance and has not had a seizure. Past Medical History:  Diagnosis Date  . Arrhythmia   . DM (diabetes mellitus) ([place])   . HTN (hypertension)   . OSA (obstructive sleep apnea)   . Palpitations   . PVC's (premature ventricular contractions)     Past Surgical History:  Procedure Laterality Date  . NO PAST SURGERIES      Current Medications: Current Meds  Medication Sig  . alendronate (FOSAMAX) 70 MG tablet TAKE 1 TABLET BY MOUTH  EVERY 7 DAYS (BONE  STRENGTH)  . atorvastatin (LIPITOR) 10 MG tablet Take 10 mg by mouth daily.   . Calcium Citrate-Vitamin D 315-250 MG-UNIT TABS Take 2 tablets by mouth  2 (two) times daily.  . cyanocobalamin (,VITAMIN B-12,) 1000 MCG/ML injection INJECT 1 ML IN THE MUSCLE EVERY 30 DAYS  . Dulaglutide (TRULICITY) 1.5 0000000 SOPN Inject 1.5 mg into the skin once a week.  Marland Kitchen ELIQUIS 5 MG TABS tablet Take 5 mg by mouth 2 (two) times daily.  . flecainide (TAMBOCOR) 50 MG tablet Take 1 tablet (50 mg total) by mouth 2 (two) times daily.  . INVOKANA 300 MG TABS tablet Take 300 mg by mouth daily.  . irbesartan (AVAPRO) 75 MG tablet Take 75 mg by mouth daily.  . metFORMIN (GLUCOPHAGE) 500 MG tablet Take 500 mg  by mouth 2 (two) times daily.  . metoprolol succinate (TOPROL-XL) 25 MG 24 hr tablet Take 1 tablet (25 mg total) by mouth daily.  . solifenacin (VESICARE) 10 MG tablet Take 10 mg by mouth daily.  . Tamsulosin HCl (FLOMAX) 0.4 MG CAPS Take 0.4 mg by mouth daily.       Allergies:   Patient has no known allergies.   Social History   Socioeconomic History  . Marital status: Single    Spouse name: Not on file  . Number of children: Not on file  . Years of education: Not on file  . Highest education level: Not on file  Occupational History  . Occupation: retired Airline pilot  Tobacco Use  . Smoking status: Never Smoker  . Smokeless tobacco: Never Used  Substance and Sexual Activity  . Alcohol use: Yes    Alcohol/week: 1.0 - 2.0 standard drinks    Types: 1 - 2 Glasses of wine per week    Comment: daily  . Drug use: Never  . Sexual activity: Not on file  Other Topics Concern  . Not on file  Social History Narrative  . Not on file   Social Determinants of Health   Financial Resource Strain:   . Difficulty of Paying Living Expenses: Not on file  Food Insecurity:   . Worried About Charity fundraiser in the Last Year: Not on file  . Ran Out of Food in the Last Year: Not on file  Transportation Needs:   . Lack of Transportation (Medical): Not on file  . Lack of Transportation (Non-Medical): Not on file  Physical Activity:   . Days of Exercise per Week: Not on file  . Minutes of Exercise per Session: Not on file  Stress:   . Feeling of Stress : Not on file  Social Connections:   . Frequency of Communication with Friends and Family: Not on file  . Frequency of Social Gatherings with Friends and Family: Not on file  . Attends Religious Services: Not on file  . Active Member of Clubs or Organizations: Not on file  . Attends Archivist Meetings: Not on file  . Marital Status: Not on file     Family History: The patient's family history includes Diabetes in his  brother; Heart disease in his father; Kidney disease in his mother. ROS:   Please see the history of present illness.    All other systems reviewed and are negative.  EKGs/Labs/Other Studies Reviewed:    The following studies were reviewed today:   Recent Labs: 01/29/2019: BUN 23; Creatinine, Ser 1.24; Potassium 4.8; Sodium 140  Recent Lipid Panel No results found for: CHOL, TRIG, HDL, CHOLHDL, VLDL, LDLCALC, LDLDIRECT  Physical Exam:    VS:  BP 118/68   Pulse 70   Ht 5\' 7"  (1.702 m)   Wt 206 lb  3.2 oz (93.5 kg)   SpO2 99%   BMI 32.30 kg/m     Wt Readings from Last 3 Encounters:  03/15/19 206 lb 3.2 oz (93.5 kg)  01/29/19 204 lb (92.5 kg)  01/22/19 202 lb (91.6 kg)     GEN:  Well nourished, well developed in no acute distress HEENT: Normal NECK: No JVD; No carotid bruits LYMPHATICS: No lymphadenopathy CARDIAC: RRR, no murmurs, rubs, gallops RESPIRATORY:  Clear to auscultation without rales, wheezing or rhonchi  ABDOMEN: Soft, non-tender, non-distended MUSCULOSKELETAL:  No edema; No deformity  SKIN: Warm and dry NEUROLOGIC:  Alert and oriented x 3 PSYCHIATRIC:  Normal affect    Signed, Wayne More, MD  03/15/2019 3:13 PM    [facility]

## 2019-03-15 ENCOUNTER — Encounter: Payer: Self-pay | Admitting: Cardiology

## 2019-03-15 ENCOUNTER — Other Ambulatory Visit: Payer: Self-pay

## 2019-03-15 ENCOUNTER — Ambulatory Visit (INDEPENDENT_AMBULATORY_CARE_PROVIDER_SITE_OTHER): Payer: Medicare HMO | Admitting: Cardiology

## 2019-03-15 DIAGNOSIS — I119 Hypertensive heart disease without heart failure: Secondary | ICD-10-CM

## 2019-03-15 DIAGNOSIS — I440 Atrioventricular block, first degree: Secondary | ICD-10-CM | POA: Diagnosis not present

## 2019-03-15 DIAGNOSIS — I480 Paroxysmal atrial fibrillation: Secondary | ICD-10-CM | POA: Diagnosis not present

## 2019-03-15 DIAGNOSIS — Z79899 Other long term (current) drug therapy: Secondary | ICD-10-CM

## 2019-03-15 DIAGNOSIS — D329 Benign neoplasm of meninges, unspecified: Secondary | ICD-10-CM | POA: Diagnosis not present

## 2019-03-15 MED ORDER — FLECAINIDE ACETATE 50 MG PO TABS: 50.0000 mg | ORAL_TABLET | Freq: Two times a day (BID) | ORAL | 4 refills | Status: DC

## 2019-03-15 MED ORDER — METOPROLOL SUCCINATE ER 25 MG PO TB24: 25.0000 mg | ORAL_TABLET | Freq: Every day | ORAL | 3 refills | Status: DC

## 2019-03-15 NOTE — Patient Instructions (Addendum)
  Medication Instructions:  Your physician recommends that you continue on your current medications as directed. Please refer to the Current Medication list given to you today.  *If you need a refill on your cardiac medications before your next appointment, please call your pharmacy*  Lab Work: Flecainide Level   If you have labs (blood work) drawn today and your tests are completely normal, you will receive your results only by: Marland Kitchen MyChart Message (if you have MyChart) OR . A paper copy in the mail If you have any lab test that is abnormal or we need to change your treatment, we will call you to review the results.  Testing/Procedures: None ordered   Follow-Up: At [facility], you and your health needs are our priority.  As part of our continuing mission to provide you with exceptional heart care, we have created designated Provider Care Teams.  These Care Teams include your primary Cardiologist (physician) and Advanced Practice Providers (APPs -  Physician Assistants and Nurse Practitioners) who all work together to provide you with the care you need, when you need it.  Your next appointment:   3 month(s)  The format for your next appointment:   In Person  Provider:   Shirlee More, MD  Other Instructions None

## 2019-03-17 ENCOUNTER — Other Ambulatory Visit: Payer: Self-pay

## 2019-03-17 ENCOUNTER — Encounter: Payer: Self-pay | Admitting: Neurology

## 2019-03-17 ENCOUNTER — Ambulatory Visit: Payer: Medicare HMO | Admitting: Neurology

## 2019-03-17 VITALS — BP 110/60 | HR 80 | Temp 97.1°F | Ht 67.5 in | Wt 202.0 lb

## 2019-03-17 DIAGNOSIS — E119 Type 2 diabetes mellitus without complications: Secondary | ICD-10-CM | POA: Diagnosis not present

## 2019-03-17 DIAGNOSIS — I480 Paroxysmal atrial fibrillation: Secondary | ICD-10-CM

## 2019-03-17 DIAGNOSIS — D329 Benign neoplasm of meninges, unspecified: Secondary | ICD-10-CM

## 2019-03-17 DIAGNOSIS — R42 Dizziness and giddiness: Secondary | ICD-10-CM | POA: Diagnosis not present

## 2019-03-17 LAB — FLECAINIDE LEVEL: Flecainide: 0.2 ug/mL (ref 0.20–1.00)

## 2019-03-17 NOTE — Progress Notes (Signed)
[facility]  PATIENT: Wayne [name] DOB: 1940/06/05  REFERRING DOCTOR OR PCP: Dr. Shirlee More (cardiology) Dr. Teressa Lower (PCP) SOURCE: Patient's, notes from Dr. Bettina Gavia, imaging reports, CT and MRI images personally reviewed.  _________________________________   HISTORICAL  CHIEF COMPLAINT:  Chief Complaint  Patient presents with  . New Patient (Initial Visit)    RM 9 with wife. Internal referral from Dr. Bettina Gavia for Meningioma. It was found on CT scan done at [facility] in [place], [place] on 08-27-2018.     HISTORY OF PRESENT ILLNESS:  I had the pleasure of seeing your patient, Wayne [name], at [facility] for neurologic consultation regarding his meningioma  He is an 79 year old man with a history of atrial fibrillation and diabetes mellitus who had a spell of nausea and dizziness on 08/27/2018 while at a [place].   He also noted a general weakness but no specific laterality.   He was taken to the [facility] ED. myocardial infarction and stroke were ruled out.  CT scan did not show any acute findings.  However, he was found to have a large meningioma on CT scan.   He had a follow up MRI that confirmed the meningioma.     He sees Dr. Bettina Gavia for atrial fibrillation and has continued to have rare dizzy spells.   He is on Eliquis for stroke prophylaxis.  I personally reviewed the MRI of the brain from 08/28/2018.  It shows a homogenously enhancing dural based mass consistent with a meningioma that indents the posterior right frontal lobe but does not cause adjacent edema.  It was measured at 30 x 30 x 16 mm.   He also has sinusitis with a mucocele in the right maxillary sinus.  I also looked at the MR angiogram from that day which did not show any significant stenosis in the arteries of the neck or the head.  The CT scan from 08/27/2018 shows the mass with minimal calcification.  He has no older images for comparison.  REVIEW OF  SYSTEMS: Constitutional: No fevers, chills, sweats, or change in appetite. Eyes: No visual changes, double vision, eye pain Ear, nose and throat: No hearing loss, ear pain, nasal congestion, sore throat Cardiovascular: No chest pain, palpitations.  He has atrial fibrillation Respiratory: No shortness of breath at rest or with exertion.   No wheezes GastrointestinaI: No nausea, vomiting, diarrhea, abdominal pain, fecal incontinence Genitourinary: No dysuria, urinary retention or frequency.  No nocturia. Musculoskeletal: No neck pain, back pain Integumentary: No rash, pruritus, skin lesions Neurological: as above Psychiatric: No depression at this time.  No anxiety Endocrine: He has well-controlled type 2 diabetes mellitus and is not on insulin. Hematologic/Lymphatic: No anemia, purpura, petechiae. Allergic/Immunologic: No itchy/runny eyes, nasal congestion, recent allergic reactions, rashes  ALLERGIES: No Known Allergies  HOME MEDICATIONS:  Current Outpatient Medications:  .  alendronate (FOSAMAX) 70 MG tablet, TAKE 1 TABLET BY MOUTH  EVERY 7 DAYS (BONE  STRENGTH), Disp: , Rfl:  .  atorvastatin (LIPITOR) 10 MG tablet, Take 10 mg by mouth daily. , Disp: , Rfl:  .  Calcium Citrate-Vitamin D 315-250 MG-UNIT TABS, Take 2 tablets by mouth 2 (two) times daily., Disp: , Rfl:  .  cyanocobalamin (,VITAMIN B-12,) 1000 MCG/ML injection, INJECT 1 ML IN THE MUSCLE EVERY 30 DAYS, Disp: , Rfl:  .  Dulaglutide (TRULICITY) 1.5 0000000 SOPN, Inject 1.5 mg into the skin once a week., Disp: , Rfl:  .  ELIQUIS 5 MG TABS  tablet, Take 5 mg by mouth 2 (two) times daily., Disp: , Rfl:  .  flecainide (TAMBOCOR) 50 MG tablet, Take 1 tablet (50 mg total) by mouth 2 (two) times daily., Disp: 60 tablet, Rfl: 4 .  INVOKANA 300 MG TABS tablet, Take 300 mg by mouth daily., Disp: , Rfl:  .  irbesartan (AVAPRO) 75 MG tablet, Take 75 mg by mouth daily., Disp: , Rfl:  .  metFORMIN (GLUCOPHAGE) 500 MG tablet, Take 500  mg by mouth 2 (two) times daily., Disp: , Rfl:  .  metoprolol succinate (TOPROL-XL) 25 MG 24 hr tablet, Take 1 tablet (25 mg total) by mouth daily., Disp: 90 tablet, Rfl: 3 .  solifenacin (VESICARE) 10 MG tablet, Take 10 mg by mouth daily., Disp: , Rfl:  .  Tamsulosin HCl (FLOMAX) 0.4 MG CAPS, Take 0.4 mg by mouth daily.  , Disp: , Rfl:   PAST MEDICAL HISTORY: Past Medical History:  Diagnosis Date  . A-fib ([place])   . Anxiety   . Arrhythmia   . B12 deficiency   . DM (diabetes mellitus) ([place])   . HTN (hypertension)   . OSA (obstructive sleep apnea)   . Palpitations   . Polymyalgia rheumatica ([place])   . Prostate nodule   . PVC's (premature ventricular contractions)     PAST SURGICAL HISTORY: Past Surgical History:  Procedure Laterality Date  . NO PAST SURGERIES      FAMILY HISTORY: Family History  Problem Relation Age of Onset  . Heart disease Father   . Kidney disease Mother   . Diabetes Brother     SOCIAL HISTORY:  Social History   Socioeconomic History  . Marital status: Single    Spouse name: Wayne [name]  . Number of children: 2  . Years of education: 80  . Highest education level: Not on file  Occupational History  . Occupation: Retired Airline pilot  Tobacco Use  . Smoking status: Never Smoker  . Smokeless tobacco: Never Used  Substance and Sexual Activity  . Alcohol use: Yes    Alcohol/week: 1.0 - 2.0 standard drinks    Types: 1 - 2 Glasses of wine per week    Comment: wine with dinner  . Drug use: Never  . Sexual activity: Not on file  Other Topics Concern  . Not on file  Social History Narrative   Lives with wife   Caffeine use: 2 cups coffee per day, Soda sometimes   Right handed    Social Determinants of Health   Financial Resource Strain:   . Difficulty of Paying Living Expenses: Not on file  Food Insecurity:   . Worried About Charity fundraiser in the Last Year: Not on file  . Ran Out of Food in the Last Year: Not on file   Transportation Needs:   . Lack of Transportation (Medical): Not on file  . Lack of Transportation (Non-Medical): Not on file  Physical Activity:   . Days of Exercise per Week: Not on file  . Minutes of Exercise per Session: Not on file  Stress:   . Feeling of Stress : Not on file  Social Connections:   . Frequency of Communication with Friends and Family: Not on file  . Frequency of Social Gatherings with Friends and Family: Not on file  . Attends Religious Services: Not on file  . Active Member of Clubs or Organizations: Not on file  . Attends Archivist Meetings: Not on file  . Marital Status: Not  on file  Intimate Partner Violence:   . Fear of Current or Ex-Partner: Not on file  . Emotionally Abused: Not on file  . Physically Abused: Not on file  . Sexually Abused: Not on file     PHYSICAL EXAM  Vitals:   03/17/19 1029  BP: 110/60  Pulse: 80  Temp: (!) 97.1 F (36.2 C)  SpO2: 98%  Weight: 202 lb (91.6 kg)  Height: 5' 7.5" (1.715 m)    Body mass index is 31.17 kg/m.   General: The patient is well-developed and well-nourished and in no acute distress  HEENT:  Head is Delta/AT.  Sclera are anicteric.  Funduscopic exam difficult due to small pupils  Neck: No carotid bruits are noted.  The neck is nontender.  Cardiovascular: The heart has a regular rate and rhythm with a normal S1 and S2. There were no murmurs, gallops or rubs.    Skin: Extremities are without rash or  edema.  Musculoskeletal:  Back is nontender  Neurologic Exam  Mental status: The patient is alert and oriented x 3 at the time of the examination. The patient has apparent normal recent and remote memory, with an apparently normal attention span and concentration ability.   Speech is normal.  Cranial nerves: Extraocular movements are full. Pupils are equal, round, and reactive to light and accomodation (2 mm to 1 mm).  Visual fields are full.  Facial symmetry is present. There is good  facial sensation to soft touch bilaterally.Facial strength is normal.  Trapezius and sternocleidomastoid strength is normal. No dysarthria is noted.    No obvious hearing deficits are noted.  Motor:  Muscle bulk is normal.   Tone is normal. Strength is  5 / 5 in all 4 extremities.   Sensory: Sensory testing is intact to pinprick, soft touch and vibration sensation in arms but mild  reduced vibration at toes.  Coordination: Cerebellar testing reveals good finger-nose-finger and heel-to-shin bilaterally.  Gait and station: Station is normal.   Gait is normal. Tandem gait is mildly wide. Romberg is negative.   Reflexes: Deep tendon reflexes are symmetric and normal bilaterally.   Plantar responses are flexor.    DIAGNOSTIC DATA (LABS, IMAGING, TESTING) - I reviewed patient records, labs, notes, testing and imaging myself where available.  Lab Results  Component Value Date   WBC 4.5 01/21/2012   HGB 9.9 (L) 01/21/2012   HCT 29.3 (L) 01/21/2012   MCV 89.9 01/21/2012   PLT 147 (L) 01/21/2012      Component Value Date/Time   NA 140 01/29/2019 1416   K 4.8 01/29/2019 1416   CL 105 01/29/2019 1416   CO2 20 01/29/2019 1416   GLUCOSE 155 (H) 01/29/2019 1416   GLUCOSE 134 (H) 01/21/2012 0700   BUN 23 01/29/2019 1416   CREATININE 1.24 01/29/2019 1416   CALCIUM 9.2 01/29/2019 1416   GFRNONAA 55 (L) 01/29/2019 1416   GFRAA 63 01/29/2019 1416       ASSESSMENT AND PLAN  Meningioma (HCC) - Plan: MR BRAIN W WO CONTRAST  Dizzy spells  Paroxysmal atrial fibrillation ([place])  Type 2 diabetes mellitus without complication, without long-term current use of insulin ([place])   In summary, Wayne [name] is an 79 year old man with diabetes mellitus and atrial fibrillation who has had a couple of dizzy spells.  One spell prompted a trip to the emergency room where a meningioma was discovered.  Although it is fairly large (30 x 30 x 16 mm) and indents the brain,  it does not cause any edema in the  adjacent brain.  I discussed with him and his wife that meningiomas often grow to a certain size and stop growing.  His meningioma is not very calcified.  Therefore, it probably has become large fairly recently years.  We discussed that it may continue to grow.  Although it is a benign tumor it can cause issues due to compression of the adjacent brain and if it continues to grow he will need to have surgery.  Hopefully it will be stable and that can be avoided.  Because it is not calcified, determination of size and change over time will be easier to assess on MRI than CT.  I will check another MRI of the brain in the next month or so.  About a year later we will check another one for comparison and then we could probably just check the MRIs every 2 to 3 years after that.  He will return to see me in 1 year or sooner based on the results of the findings or if he has any new symptoms.  Thank you for asking me to see Wayne [name].  Please let me know if I can be of further assistance with him or other patients in the future.  Terry Abila A. Felecia Shelling, MD, [facility] [id number], 0000000 PM Certified in Neurology, Clinical Neurophysiology, Sleep Medicine and Neuroimaging  [facility] [address] [place], [place] [address] [phone]

## 2019-03-23 ENCOUNTER — Telehealth: Payer: Self-pay

## 2019-03-23 NOTE — Telephone Encounter (Signed)
Patient would like his MRI sent to [facility] in [place] as it is closer to him   Patient would like a CB once processed

## 2019-03-24 ENCOUNTER — Other Ambulatory Visit: Payer: Self-pay | Admitting: *Deleted

## 2019-03-24 DIAGNOSIS — I480 Paroxysmal atrial fibrillation: Secondary | ICD-10-CM

## 2019-03-24 DIAGNOSIS — D329 Benign neoplasm of meninges, unspecified: Secondary | ICD-10-CM

## 2019-03-24 DIAGNOSIS — E119 Type 2 diabetes mellitus without complications: Secondary | ICD-10-CM

## 2019-03-24 DIAGNOSIS — R42 Dizziness and giddiness: Secondary | ICD-10-CM

## 2019-03-24 NOTE — Telephone Encounter (Signed)
[employer] medicare Wayne [id number] (exp. 03/24/19 to 09/20/19) patient is scheduled at [facility] for Friday 03/26/19 arrival time is 11 AM patient is aware of time and day. I gave them their number of [phone] incase he needed to r/s.

## 2019-03-24 NOTE — Telephone Encounter (Signed)
Noted, thank you. DWD

## 2019-03-25 ENCOUNTER — Encounter: Payer: Self-pay | Admitting: Neurology

## 2019-03-25 NOTE — Telephone Encounter (Signed)
Faxed lab orders for BUN/creatinine to be done prior to MRI at [phone]. Received fax confirmation.

## 2019-04-02 ENCOUNTER — Telehealth: Payer: Self-pay | Admitting: Neurology

## 2019-04-02 NOTE — Telephone Encounter (Signed)
Pt called wanting to know what the update is on his MRI. Pt states that if he does not pick up you can leave a VM. Please advise.

## 2019-04-05 ENCOUNTER — Telehealth: Payer: Self-pay | Admitting: *Deleted

## 2019-04-05 NOTE — Telephone Encounter (Signed)
-----   Message from Britt Bottom, MD sent at 04/02/2019  1:39 PM EST ----- Regarding: MRI He had a brain [facility]   I reviewed the scan and compared to his previous one.  The meningioma is unchanged in size --- we will want to re-image in about a year

## 2019-04-05 NOTE — Telephone Encounter (Signed)
Hilda Blades, can you see if MRI results are ready? He had MRI at [facility]

## 2019-04-05 NOTE — Telephone Encounter (Signed)
Called, LVM about MRI results per Dr. Felecia Shelling note. Asked him to call back if any further questions/concerns. Otherwise, no need to call back.

## 2019-06-12 NOTE — Progress Notes (Signed)
Cardiology Office Note:    Date:  06/14/2019   ID:  Wayne [name], DOB Jul 12, 1940, MRN [id number]  PCP:  Algis Greenhouse, MD  Cardiologist:  Shirlee More, MD    Referring MD: Algis Greenhouse, MD    ASSESSMENT:    1. Paroxysmal atrial fibrillation (HCC)   2. SVT (supraventricular tachycardia) ([place])   3. High risk medication use   4. Hypertensive heart disease without heart failure   5. Meningioma ([place])   6. Chronic anticoagulation    PLAN:    In order of problems listed above:  1. Overall he has done well no sustained arrhythmia tolerates flecainide and his anticoagulant. 2. Stable no evidence of toxicity flecainide 3. BP at target continue current treatment 4. Has had neurology evaluation does not require intervention and is safe for anticoagulation.   Next appointment: 6 months   Medication Adjustments/Labs and Tests Ordered: Current medicines are reviewed at length with the patient today.  Concerns regarding medicines are outlined above.  No orders of the defined types were placed in this encounter.  No orders of the defined types were placed in this encounter.   Chief Complaint  Patient presents with  . Follow-up    For SVT on flecainide    History of Present Illness:    Wayne [name] is a 79 y.o. 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 Compliance with diet, lifestyle and medications: Yes  Review the results of his flecainide level and EKG with patient.  He has been seen by neurology and requires no intervention for his meningioma.  He is enjoying life rarely gets a palpitation not severe sustained and is much more confident.  He tolerates his anticoagulant without bleeding complication  no syncope shortness of breath chest pain.  Flecainide level low therapeutic last visit:  Ref Range & Units 2 mo ago  Flecainide 0.20 - 1.00 ug/mL 0.20   EKG 01/29/2019 independently reviewed sinus rhythm first-degree AV block no evidence of 1c antiarrhythmic toxicity I reviewed the neurology note Dr. Felecia Shelling 02/25/2019 stable meningioma no indication for intervention and safe for anticoagulation. Past Medical History:  Diagnosis Date  . A-fib ([place])   . Acute kidney injury ([place]) 01/20/2012  . Adenomatous colon polyp 12/17/2015  . Anemia 01/21/2012  . Anemia, pernicious 06/12/2015  . Anxiety   . Arrhythmia   . B12 deficiency   . Benign hypertension 06/12/2015  . 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 Managed UROL  . Chronic atrial fibrillation ([place]) 06/12/2015   Formatting of this note might be different from the original. Managed PMD  . Dehydration 01/20/2012  . Dizzy spells 08/31/2018   Formatting of this note might be different from the original. 2020: hosp, neg eval  . DM (diabetes mellitus) ([place])   . Elevated prostate specific antigen (PSA) 12/10/2010   Formatting of this note might be different from the original. Managed UROL  . Family history of abdominal aortic aneurysm 06/13/2015   Formatting of this note might be different from the original. Brother - 2017: sono  . HTN (hypertension)   . Hyperlipidemia, mixed 01/20/2012  . Hypertensive heart disease 01/20/2012  . Hypotension 01/20/2012  .  Lumbar radiculopathy 06/13/2015   Formatting of this note might be different from the original. 2017: right S1  . Meningioma ([place]) 09/01/2018   Formatting of this note might be different from the original. 2020: incidental CT/MRI, right parietal  . Obesity 01/21/2012  . Obstructive sleep apnea 01/20/2012   Formatting of this note might be different from the original. Managed PMD  . OSA (obstructive sleep apnea)    . Other osteoporosis without current pathological fracture 12/10/2015   Formatting of this note might be different from the original. Managed PMD, had been managed RHEUM, low D and chronic prednisone 2015: hip -2.9 2015: alendronate 70 mg weekly 2017: -2.2  . Palpitations   . Paroxysmal atrial fibrillation ([place]) 01/29/2019  . Polymyalgia rheumatica ([place])   . Prostate nodule   . PVC's (premature ventricular contractions)   . Stage 3 chronic kidney disease 06/12/2015   Formatting of this note might be different from the original. 2015: 52 2016: 54 2017: 48  . SVT (supraventricular tachycardia) ([place]) 01/20/2012  . Type 2 diabetes mellitus ([place]) 01/20/2012  . Urge incontinence of urine 02/14/2014    History reviewed. No pertinent surgical history.  Current Medications: Current Meds  Medication Sig  . alendronate (FOSAMAX) 70 MG tablet TAKE 1 TABLET BY MOUTH  EVERY 7 DAYS (BONE  STRENGTH)  . atorvastatin (LIPITOR) 10 MG tablet Take 10 mg by mouth daily.   . Calcium Citrate-Vitamin D 315-250 MG-UNIT TABS Take 2 tablets by mouth 2 (two) times daily.  . cyanocobalamin (,VITAMIN B-12,) 1000 MCG/ML injection INJECT 1 ML IN THE MUSCLE EVERY 30 DAYS  . Dulaglutide (TRULICITY) 1.5 0000000 SOPN Inject 1.5 mg into the skin once a week.  Marland Kitchen ELIQUIS 5 MG TABS tablet Take 5 mg by mouth 2 (two) times daily.  . flecainide (TAMBOCOR) 50 MG tablet Take 1 tablet (50 mg total) by mouth 2 (two) times daily.  . INVOKANA 300 MG TABS tablet Take 300 mg by mouth daily.  . irbesartan (AVAPRO) 75 MG tablet Take 75 mg by mouth daily.  . metFORMIN (GLUCOPHAGE) 500 MG tablet Take 500 mg by mouth 2 (two) times daily.  . metoprolol succinate (TOPROL-XL) 25 MG 24 hr tablet Take 1 tablet (25 mg total) by mouth daily.  . solifenacin (VESICARE) 10 MG tablet Take 10 mg by mouth daily.  . Tamsulosin HCl (FLOMAX) 0.4 MG CAPS Take 0.4 mg by mouth daily.       Allergies:   Patient has no known allergies.   Social History    Socioeconomic History  . Marital status: Single    Spouse name: Alf Smrekar  . Number of children: 2  . Years of education: 76  . Highest education level: Not on file  Occupational History  . Occupation: Retired Airline pilot  Tobacco Use  . Smoking status: Never Smoker  . Smokeless tobacco: Never Used  Substance and Sexual Activity  . Alcohol use: Yes    Alcohol/week: 1.0 - 2.0 standard drinks    Types: 1 - 2 Glasses of wine per week    Comment: wine with dinner  . Drug use: Never  . Sexual activity: Not on file  Other Topics Concern  . Not on file  Social History Narrative   Lives with wife   Caffeine use: 2 cups coffee per day, Soda sometimes   Right handed    Social Determinants of Health   Financial Resource Strain:   . Difficulty of Paying Living Expenses:   Food Insecurity:   .  Worried About Charity fundraiser in the Last Year:   . Arboriculturist in the Last Year:   Transportation Needs:   . Film/video editor (Medical):   Marland Kitchen Lack of Transportation (Non-Medical):   Physical Activity:   . Days of Exercise per Week:   . Minutes of Exercise per Session:   Stress:   . Feeling of Stress :   Social Connections:   . Frequency of Communication with Friends and Family:   . Frequency of Social Gatherings with Friends and Family:   . Attends Religious Services:   . Active Member of Clubs or Organizations:   . Attends Archivist Meetings:   Marland Kitchen Marital Status:      Family History: The patient's family history includes Diabetes in his brother; Heart disease in his father; Kidney disease in his mother. ROS:   Please see the history of present illness.    All other systems reviewed and are negative.  EKGs/Labs/Other Studies Reviewed:    The following studies were reviewed today  Recent Labs: 01/29/2019: BUN 23; Creatinine, Ser 1.24; Potassium 4.8; Sodium 140  Recent Lipid Panel No results found for: CHOL, TRIG, HDL, CHOLHDL, VLDL, LDLCALC,  LDLDIRECT  Physical Exam:    VS:  BP 132/60   Pulse 72   Temp (!) 97.3 F (36.3 C)   Ht 5' 7.5" (1.715 m)   Wt 202 lb 6.4 oz (91.8 kg)   SpO2 99%   BMI 31.23 kg/m     Wt Readings from Last 3 Encounters:  06/14/19 202 lb 6.4 oz (91.8 kg)  03/17/19 202 lb (91.6 kg)  03/15/19 206 lb 3.2 oz (93.5 kg)     GEN:  Well nourished, well developed in no acute distress HEENT: Normal NECK: No JVD; No carotid bruits LYMPHATICS: No lymphadenopathy CARDIAC: RRR, no murmurs, rubs, gallops RESPIRATORY:  Clear to auscultation without rales, wheezing or rhonchi  ABDOMEN: Soft, non-tender, non-distended MUSCULOSKELETAL:  No edema; No deformity  SKIN: Warm and dry NEUROLOGIC:  Alert and oriented x 3 PSYCHIATRIC:  Normal affect    Signed, Shirlee More, MD  06/14/2019 10:50 AM    [place]

## 2019-06-14 ENCOUNTER — Other Ambulatory Visit: Payer: Self-pay

## 2019-06-14 ENCOUNTER — Ambulatory Visit: Payer: Medicare HMO | Admitting: Cardiology

## 2019-06-14 ENCOUNTER — Encounter: Payer: Self-pay | Admitting: Cardiology

## 2019-06-14 VITALS — BP 132/60 | HR 72 | Temp 97.3°F | Ht 67.5 in | Wt 202.4 lb

## 2019-06-14 DIAGNOSIS — Z79899 Other long term (current) drug therapy: Secondary | ICD-10-CM

## 2019-06-14 DIAGNOSIS — I119 Hypertensive heart disease without heart failure: Secondary | ICD-10-CM | POA: Diagnosis not present

## 2019-06-14 DIAGNOSIS — I471 Supraventricular tachycardia: Secondary | ICD-10-CM

## 2019-06-14 DIAGNOSIS — Z7901 Long term (current) use of anticoagulants: Secondary | ICD-10-CM

## 2019-06-14 DIAGNOSIS — I480 Paroxysmal atrial fibrillation: Secondary | ICD-10-CM

## 2019-06-14 DIAGNOSIS — D329 Benign neoplasm of meninges, unspecified: Secondary | ICD-10-CM

## 2019-06-14 NOTE — Patient Instructions (Signed)

## 2019-08-11 ENCOUNTER — Other Ambulatory Visit: Payer: Self-pay | Admitting: Cardiology

## 2020-02-20 ENCOUNTER — Other Ambulatory Visit: Payer: Self-pay | Admitting: Cardiology

## 2020-03-16 ENCOUNTER — Ambulatory Visit: Payer: Medicare HMO | Admitting: Neurology

## 2020-03-16 ENCOUNTER — Encounter: Payer: Self-pay | Admitting: Neurology

## 2020-03-16 VITALS — BP 134/68 | HR 75 | Ht 67.5 in | Wt 198.0 lb

## 2020-03-16 DIAGNOSIS — I480 Paroxysmal atrial fibrillation: Secondary | ICD-10-CM | POA: Diagnosis not present

## 2020-03-16 DIAGNOSIS — D329 Benign neoplasm of meninges, unspecified: Secondary | ICD-10-CM | POA: Diagnosis not present

## 2020-03-16 DIAGNOSIS — R42 Dizziness and giddiness: Secondary | ICD-10-CM | POA: Diagnosis not present

## 2020-03-16 NOTE — Progress Notes (Signed)
[facility]  PATIENT: Wayne [name] DOB: March 05, 1940  REFERRING DOCTOR OR PCP: Dr. Shirlee More (cardiology) Dr. Teressa Lower (PCP) SOURCE: Patient's, notes from Dr. Bettina Gavia, imaging reports, CT and MRI images personally reviewed.  _________________________________   HISTORICAL  CHIEF COMPLAINT:  Chief Complaint  Patient presents with  . Follow-up    RM 12, alone. Last seen 03/17/19. Hx Meningioma. No falls since last seen. Needs repeat MRI. Feeling good, no new problems.     HISTORY OF PRESENT ILLNESS:   Wayne [name] is a 80 y.o. man with a meningioma  Update 03/16/2020:   Since the last visit, he has noted no new symptoms.   He gets occasioanl dizziness or lightheadedness but this has been a problem for couple years and is not necessarily worse than last year.  He notes no change in his gait.  There is mild reduced balance though probably normal for age.  He denies any specific weakness or numbness.  No difficulties with speech.  No neglect.  I discussed the MRIs with him the second MRI, about a year ago, did not show any definite change in size for the meningioma.  We will need to follow this for a few more years.  Meningioma History: He had a spell of nausea and dizziness on 08/27/2018 while at a gas station.   He also noted a general weakness but no specific laterality.   He was taken to the [facility] ED. myocardial infarction and stroke were ruled out.  CT scan did not show any acute findings.  However, he was found to have a large meningioma on CT scan.   He had a follow up MRI that confirmed the meningioma.     Other:  He sees Dr. Bettina Gavia for atrial fibrillation and has continued to have rare dizzy spells.   He is on Eliquis for stroke prophylaxis.   IMAGING MRI of the brain from 08/28/2018. shows a homogenously enhancing dural based mass consistent with a meningioma that indents the posterior right frontal lobe but does not cause adjacent edema.   It was measured at 30 x 30 x 16 mm.   He also has sinusitis with a mucocele in the right maxillary sinus.  I also looked at the MR angiogram from that day which did not show any significant stenosis in the arteries of the neck or the head.  The CT scan from 08/27/2018 shows the mass with minimal calcification.  He has no older images for comparison.  MRI brain 04/02/2019 at [facility] showed no increase in size when I compared images side by side (though official measurement was 24x31x16 mm)   REVIEW OF SYSTEMS: Constitutional: No fevers, chills, sweats, or change in appetite. Eyes: No visual changes, double vision, eye pain Ear, nose and throat: No hearing loss, ear pain, nasal congestion, sore throat Cardiovascular: No chest pain, palpitations.  He has atrial fibrillation Respiratory: No shortness of breath at rest or with exertion.   No wheezes GastrointestinaI: No nausea, vomiting, diarrhea, abdominal pain, fecal incontinence Genitourinary: No dysuria, urinary retention or frequency.  No nocturia. Musculoskeletal: No neck pain, back pain Integumentary: No rash, pruritus, skin lesions Neurological: as above Psychiatric: No depression at this time.  No anxiety Endocrine: He has well-controlled type 2 diabetes mellitus and is not on insulin. Hematologic/Lymphatic: No anemia, purpura, petechiae. Allergic/Immunologic: No itchy/runny eyes, nasal congestion, recent allergic reactions, rashes  ALLERGIES: No Known Allergies  HOME MEDICATIONS:  Current Outpatient Medications:  .  alendronate (FOSAMAX) 70  MG tablet, TAKE 1 TABLET BY MOUTH  EVERY 7 DAYS (BONE  STRENGTH), Disp: , Rfl:  .  atorvastatin (LIPITOR) 10 MG tablet, Take 10 mg by mouth daily., Disp: , Rfl:  .  Calcium Citrate-Vitamin D 315-250 MG-UNIT TABS, Take 2 tablets by mouth 2 (two) times daily., Disp: , Rfl:  .  cyanocobalamin (,VITAMIN B-12,) 1000 MCG/ML injection, INJECT 1 ML IN THE MUSCLE EVERY 30 DAYS, Disp: , Rfl:  .   Dulaglutide (TRULICITY) 1.5 VE/9.3YB SOPN, Inject 1.5 mg into the skin once a week., Disp: , Rfl:  .  ELIQUIS 5 MG TABS tablet, Take 5 mg by mouth 2 (two) times daily., Disp: , Rfl:  .  flecainide (TAMBOCOR) 50 MG tablet, TAKE 1 TABLET BY MOUTH  TWICE DAILY, Disp: 180 tablet, Rfl: 2 .  INVOKANA 300 MG TABS tablet, Take 300 mg by mouth daily., Disp: , Rfl:  .  irbesartan (AVAPRO) 75 MG tablet, Take 75 mg by mouth daily., Disp: , Rfl:  .  metFORMIN (GLUCOPHAGE) 500 MG tablet, Take 500 mg by mouth 2 (two) times daily., Disp: , Rfl:  .  metoprolol succinate (TOPROL-XL) 25 MG 24 hr tablet, Take 1 tablet (25 mg total) by mouth daily. NEEDS APPOINTMENT FOR FUTURE REFILL / 1st Attempt, Disp: 90 tablet, Rfl: 0 .  solifenacin (VESICARE) 10 MG tablet, Take 10 mg by mouth daily., Disp: , Rfl:  .  tamsulosin (FLOMAX) 0.4 MG CAPS capsule, Take 0.4 mg by mouth daily., Disp: , Rfl:   PAST MEDICAL HISTORY: Past Medical History:  Diagnosis Date  . A-fib ([place])   . Acute kidney injury ([place]) 01/20/2012  . Adenomatous colon polyp 12/17/2015  . Anemia 01/21/2012  . Anemia, pernicious 06/12/2015  . Anxiety   . Arrhythmia   . B12 deficiency   . Benign hypertension 06/12/2015  . 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 Managed UROL  . Chronic atrial fibrillation ([place]) 06/12/2015   Formatting of this note might be different from the original. Managed PMD  . Dehydration 01/20/2012  . Dizzy spells 08/31/2018   Formatting of this note might be different from the original. 2020: hosp, neg eval  . DM (diabetes mellitus) ([place])   . Elevated prostate specific antigen (PSA) 12/10/2010   Formatting of this note might be different from the original. Managed UROL  . Family history of abdominal aortic aneurysm 06/13/2015   Formatting of this note might be different from the original. Brother - 2017: sono  . HTN (hypertension)   .  Hyperlipidemia, mixed 01/20/2012  . Hypertensive heart disease 01/20/2012  . Hypotension 01/20/2012  . Lumbar radiculopathy 06/13/2015   Formatting of this note might be different from the original. 2017: right S1  . Meningioma ([place]) 09/01/2018   Formatting of this note might be different from the original. 2020: incidental CT/MRI, right parietal  . Obesity 01/21/2012  . Obstructive sleep apnea 01/20/2012   Formatting of this note might be different from the original. Managed PMD  . OSA (obstructive sleep apnea)   . Other osteoporosis without current pathological fracture 12/10/2015   Formatting of this note might be different from the original. Managed PMD, had been managed RHEUM, low D and chronic prednisone 2015: hip -2.9 2015: alendronate 70 mg weekly 2017: -2.2  . Palpitations   . Paroxysmal atrial fibrillation ([place]) 01/29/2019  . Polymyalgia rheumatica ([place])   . Prostate nodule   . PVC's (premature ventricular  contractions)   . Stage 3 chronic kidney disease ([place]) 06/12/2015   Formatting of this note might be different from the original. 2015: 54 2016: 54 2017: 50  . SVT (supraventricular tachycardia) ([place]) 01/20/2012  . Type 2 diabetes mellitus ([place]) 01/20/2012  . Urge incontinence of urine 02/14/2014    PAST SURGICAL HISTORY: History reviewed. No pertinent surgical history.  FAMILY HISTORY: Family History  Problem Relation Age of Onset  . Heart disease Father   . Kidney disease Mother   . Diabetes Brother     SOCIAL HISTORY:  Social History   Socioeconomic History  . Marital status: Single    Spouse name: Wayne [name]  . Number of children: 2  . Years of education: 44  . Highest education level: Not on file  Occupational History  . Occupation: Retired Airline pilot  Tobacco Use  . Smoking status: Never Smoker  . Smokeless tobacco: Never Used  Vaping Use  . Vaping Use: Never used  Substance and Sexual Activity  . Alcohol use: Yes    Alcohol/week: 1.0 - 2.0 standard  drink    Types: 1 - 2 Glasses of wine per week    Comment: wine with dinner  . Drug use: Never  . Sexual activity: Not on file  Other Topics Concern  . Not on file  Social History Narrative   Lives with wife   Caffeine use: 2 cups coffee per day, Soda sometimes   Right handed    Social Determinants of Health   Financial Resource Strain: Not on file  Food Insecurity: Not on file  Transportation Needs: Not on file  Physical Activity: Not on file  Stress: Not on file  Social Connections: Not on file  Intimate Partner Violence: Not on file     PHYSICAL EXAM  Vitals:   03/16/20 1029  BP: 134/68  Pulse: 75  Weight: 198 lb (89.8 kg)  Height: 5' 7.5" (1.715 m)    Body mass index is 30.55 kg/m.   General: The patient is well-developed and well-nourished and in no acute distress  HEENT:  Head is [place]/AT.  Sclera are anicteric.  Funduscopic exam difficult due to small pupils but no papilledema noted  Neck: No carotid bruits are noted.  The neck is nontender.   Skin: Extremities are without rash or  edema.  Neurologic Exam  Mental status: The patient is alert and oriented x 3 at the time of the examination. The patient has apparent normal recent and remote memory, with an apparently normal attention span and concentration ability.   Speech is normal.  Cranial nerves: Extraocular movements are full. Pupils are equal, round, and reactive to light and accomodation (2 mm to 1 mm).    Facial symmetry is present. There is good facial sensation to soft touch bilaterally.Facial strength is normal.  Trapezius and sternocleidomastoid strength is normal. No dysarthria is noted.    No obvious hearing deficits are noted.  Motor:  Muscle bulk is normal.   Tone is normal. Strength is  5 / 5 in all 4 extremities except 4+ left triceps  Sensory: Sensory testing is intact to pinprick, soft touch and vibration sensation in arms but mild  reduced vibration at toes.  Coordination: Cerebellar  testing reveals good finger-nose-finger and heel-to-shin bilaterally.  Gait and station: Station is normal.   Gait is normal. Tandem gait is mildly wide. Romberg is negative.   Reflexes: Deep tendon reflexes are symmetric and normal bilaterally.   Plantar responses are flexor.  DIAGNOSTIC DATA (LABS, IMAGING, TESTING) - I reviewed patient records, labs, notes, testing and imaging myself where available.  Lab Results  Component Value Date   WBC 4.5 01/21/2012   HGB 9.9 (L) 01/21/2012   HCT 29.3 (L) 01/21/2012   MCV 89.9 01/21/2012   PLT 147 (L) 01/21/2012      Component Value Date/Time   NA 140 01/29/2019 1416   K 4.8 01/29/2019 1416   CL 105 01/29/2019 1416   CO2 20 01/29/2019 1416   GLUCOSE 155 (H) 01/29/2019 1416   GLUCOSE 134 (H) 01/21/2012 0700   BUN 23 01/29/2019 1416   CREATININE 1.24 01/29/2019 1416   CALCIUM 9.2 01/29/2019 1416   GFRNONAA 55 (L) 01/29/2019 1416   GFRAA 63 01/29/2019 1416       ASSESSMENT AND PLAN  Meningioma ([place]) - Plan: MR BRAIN W WO CONTRAST  Paroxysmal atrial fibrillation (HCC)  Dizzy spells  1.  We need to repeat the MRI of the brain to determine if the meningioma has significantly changed in size.  I did note mild weakness in the left arm that I believe was not present at his last visit. 2.  He should continue to stay active and exercise.  3.  He will return in about a year for regular visit or sooner if new or worsening neurologic symptoms.  Hugh Kamara A. Felecia Shelling, MD, [facility] 3/82/5053, 9:76 PM Certified in Neurology, Clinical Neurophysiology, Sleep Medicine and Neuroimaging  [facility] [address] [place], [place] [address] [phone]

## 2020-03-20 ENCOUNTER — Encounter: Payer: Self-pay | Admitting: Neurology

## 2020-03-20 ENCOUNTER — Telehealth: Payer: Self-pay | Admitting: Neurology

## 2020-03-20 NOTE — Telephone Encounter (Signed)
Wayne [name]: [id number] (exp. 03/20/20 to 09/16/20)\  Patient is scheduled at [facility] for 03/22/20 to arrive at 11 AM. Patient wife Vaughan Basta on the [facility] is aware of time and day. I also gave her their number of [phone] incase they needed to r/s.

## 2020-04-04 ENCOUNTER — Telehealth: Payer: Self-pay | Admitting: *Deleted

## 2020-04-04 NOTE — Telephone Encounter (Signed)
R/c cd and report from [facility] cd on [place] desk

## 2020-04-14 ENCOUNTER — Other Ambulatory Visit: Payer: Self-pay | Admitting: Cardiology

## 2020-04-14 NOTE — Telephone Encounter (Signed)
Refill sent to pharmacy.   

## 2020-04-24 DIAGNOSIS — E119 Type 2 diabetes mellitus without complications: Secondary | ICD-10-CM | POA: Insufficient documentation

## 2020-04-24 DIAGNOSIS — I499 Cardiac arrhythmia, unspecified: Secondary | ICD-10-CM | POA: Insufficient documentation

## 2020-04-24 DIAGNOSIS — I4891 Unspecified atrial fibrillation: Secondary | ICD-10-CM | POA: Insufficient documentation

## 2020-04-24 DIAGNOSIS — I10 Essential (primary) hypertension: Secondary | ICD-10-CM | POA: Insufficient documentation

## 2020-04-24 DIAGNOSIS — F419 Anxiety disorder, unspecified: Secondary | ICD-10-CM | POA: Insufficient documentation

## 2020-04-24 DIAGNOSIS — I493 Ventricular premature depolarization: Secondary | ICD-10-CM | POA: Insufficient documentation

## 2020-04-24 DIAGNOSIS — R002 Palpitations: Secondary | ICD-10-CM | POA: Insufficient documentation

## 2020-04-24 DIAGNOSIS — G4733 Obstructive sleep apnea (adult) (pediatric): Secondary | ICD-10-CM | POA: Insufficient documentation

## 2020-04-28 ENCOUNTER — Ambulatory Visit: Payer: Medicare HMO | Admitting: Cardiology

## 2020-04-28 ENCOUNTER — Other Ambulatory Visit: Payer: Self-pay

## 2020-04-28 ENCOUNTER — Encounter: Payer: Self-pay | Admitting: Cardiology

## 2020-04-28 VITALS — BP 122/64 | HR 78 | Ht 67.5 in | Wt 197.8 lb

## 2020-04-28 DIAGNOSIS — Z79899 Other long term (current) drug therapy: Secondary | ICD-10-CM

## 2020-04-28 DIAGNOSIS — I119 Hypertensive heart disease without heart failure: Secondary | ICD-10-CM | POA: Diagnosis not present

## 2020-04-28 DIAGNOSIS — Z7901 Long term (current) use of anticoagulants: Secondary | ICD-10-CM

## 2020-04-28 DIAGNOSIS — I480 Paroxysmal atrial fibrillation: Secondary | ICD-10-CM

## 2020-04-28 NOTE — Patient Instructions (Signed)

## 2020-04-28 NOTE — Progress Notes (Signed)
Cardiology Office Note:    Date:  04/28/2020   ID:  Wayne [name], DOB [birth year], MRN [id number]  PCP:  Algis Greenhouse, MD  Cardiologist:  Shirlee More, MD    Referring MD: Algis Greenhouse, MD    ASSESSMENT:    1. Paroxysmal atrial fibrillation (HCC)   2. Hypertensive heart disease without heart failure   3. High risk medication use   4. Chronic anticoagulation    PLAN:    In order of problems listed above:  1. He is doing well maintaining sinus rhythm on low-dose flecainide without toxicity and continue his current anticoagulant. 2. Stable BP at target continue current treatment including low-dose beta-blocker necessary when you take an antiarrhythmic drug flecainide as well as ARB 3. Continue flecainide no sign of toxicity 4. Moderate stroke risk continue Eliquis his meningioma is stable and he follows with neurology   Next appointment: 6 months antiarrhythmic drug follow-up   Medication Adjustments/Labs and Tests Ordered: Current medicines are reviewed at length with the patient today.  Concerns regarding medicines are outlined above.  Orders Placed This Encounter  Procedures  . EKG 12-Lead   No orders of the defined types were placed in this encounter.   Chief Complaint  Patient presents with  . Follow-up  . Atrial Fibrillation    On flecainide and anticoagulated    History of Present Illness:    Wayne [name] is a 80 y.o. male with a hx of paroxysmal atrial fibrillation suppressed with flecainide and anticoagulated hypertensive heart disease without heart failure and meningioma last seen 06/14/2019. Compliance with diet, lifestyle and medications: He continues to do well and is compliant with his medications and has had no bleeding complication of his anticoagulant. Meningioma is stable on repeat imaging He has had no edema orthopnea shortness of breath chest pain palpitation or syncope and tolerates his anticoagulant without bleeding. Past Medical  History:  Diagnosis Date  . A-fib ([place])   . Acute kidney injury ([place]) 01/20/2012  . Adenomatous colon polyp 12/17/2015  . Anemia 01/21/2012  . Anemia, pernicious 06/12/2015  . Anxiety   . Arrhythmia   . B12 deficiency   . Benign hypertension 06/12/2015  . 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 Managed UROL  . Chronic atrial fibrillation ([place]) 06/12/2015   Formatting of this note might be different from the original. Managed PMD  . Dehydration 01/20/2012  . Dizzy spells 08/31/2018   Formatting of this note might be different from the original. 2020: hosp, neg eval  . DM (diabetes mellitus) ([place])   . Elevated prostate specific antigen (PSA) 12/10/2010   Formatting of this note might be different from the original. Managed UROL  . Family history of abdominal aortic aneurysm 06/13/2015   Formatting of this note might be different from the original. Brother - 2017: sono  . HTN (hypertension)   . Hyperlipidemia, mixed 01/20/2012  . Hypertensive heart disease 01/20/2012  . Hypotension 01/20/2012  . Lumbar radiculopathy 06/13/2015   Formatting of this note might be different from the original. 2017: right S1  . Meningioma ([place]) 09/01/2018   Formatting of this note might be different from the original. 2020: incidental CT/MRI, right parietal  . Obesity 01/21/2012  . Obstructive sleep apnea 01/20/2012   Formatting of this note might be different from the original. Managed PMD  . OSA (obstructive sleep apnea)   . Other osteoporosis without current pathological fracture 12/10/2015  Formatting of this note might be different from the original. Managed PMD, had been managed RHEUM, low D and chronic prednisone 2015: hip -2.9 2015: alendronate 70 mg weekly 2017: -2.2  . Palpitations   . Paroxysmal atrial fibrillation ([place]) 01/29/2019  . Polymyalgia rheumatica ([place])   . Prostate nodule   . PVC's (premature ventricular  contractions)   . Stage 3 chronic kidney disease ([place]) 06/12/2015   Formatting of this note might be different from the original. 2015: 54 2016: 54 2017: 50  . SVT (supraventricular tachycardia) ([place]) 01/20/2012  . Type 2 diabetes mellitus ([place]) 01/20/2012  . Urge incontinence of urine 02/14/2014    No past surgical history on file.  Current Medications: Current Meds  Medication Sig  . alendronate (FOSAMAX) 70 MG tablet TAKE 1 TABLET BY MOUTH  EVERY 7 DAYS (BONE  STRENGTH)  . atorvastatin (LIPITOR) 10 MG tablet Take 10 mg by mouth daily.  . Calcium Citrate-Vitamin D 315-250 MG-UNIT TABS Take 2 tablets by mouth 2 (two) times daily.  . cyanocobalamin (,VITAMIN B-12,) 1000 MCG/ML injection INJECT 1 ML IN THE MUSCLE EVERY 30 DAYS  . Dulaglutide (TRULICITY) 1.5 FG/1.8EX SOPN Inject 1.5 mg into the skin once a week.  [place] ELIQUIS 5 MG TABS tablet Take 5 mg by mouth 2 (two) times daily.  . flecainide (TAMBOCOR) 50 MG tablet TAKE 1 TABLET BY MOUTH  TWICE DAILY  . INVOKANA 300 MG TABS tablet Take 300 mg by mouth daily.  . irbesartan (AVAPRO) 75 MG tablet Take 75 mg by mouth daily.  . metFORMIN (GLUCOPHAGE) 500 MG tablet Take 1,000 mg by mouth 2 (two) times daily.  . metoprolol succinate (TOPROL-XL) 25 MG 24 hr tablet Take 1 tablet (25 mg total) by mouth daily. NEEDS APPOINTMENT FOR FUTURE REFILL / 1st Attempt  . solifenacin (VESICARE) 10 MG tablet Take 10 mg by mouth daily.  . tamsulosin (FLOMAX) 0.4 MG CAPS capsule Take 0.4 mg by mouth daily.     Allergies:   Patient has no known allergies.   Social History   Socioeconomic History  . Marital status: Single    Spouse name: Markee Matera  . Number of children: 2  . Years of education: 49  . Highest education level: Not on file  Occupational History  . Occupation: Retired Airline pilot  Tobacco Use  . Smoking status: Never Smoker  . Smokeless tobacco: Never Used  Vaping Use  . Vaping Use: Never used  Substance and Sexual Activity  .  Alcohol use: Yes    Alcohol/week: 1.0 - 2.0 standard drink    Types: 1 - 2 Glasses of wine per week    Comment: wine with dinner  . Drug use: Never  . Sexual activity: Not on file  Other Topics Concern  . Not on file  Social History Narrative   Lives with wife   Caffeine use: 2 cups coffee per day, Soda sometimes   Right handed    Social Determinants of Health   Financial Resource Strain: Not on file  Food Insecurity: Not on file  Transportation Needs: Not on file  Physical Activity: Not on file  Stress: Not on file  Social Connections: Not on file     Family History: The patient's family history includes Diabetes in his brother; Heart disease in his father; Kidney disease in his mother. ROS:   Please see the history of present illness.    All other systems reviewed and are negative.  EKGs/Labs/Other Studies Reviewed:  The following studies were reviewed today:  EKG:  EKG ordered today and personally reviewed.  The ekg ordered today demonstrates sinus rhythm narrow QRS normal EKG and no sign of Group 1 CAD antiarrhythmic drug toxicity  Recent Labs:01/05/2020: CMP potassium 4.6 creatinine 1.36 GFR 49 cc normal liver function test cholesterol 81 LDL 44 HDL 34  Physical Exam:    VS:  BP 122/64 (BP Location: Right Arm, Patient Position: Sitting, Cuff Size: Normal)   Pulse 78   Ht 5' 7.5" (1.715 m)   Wt 197 lb 12.8 oz (89.7 kg)   SpO2 96%   BMI 30.52 kg/m     Wt Readings from Last 3 Encounters:  04/28/20 197 lb 12.8 oz (89.7 kg)  03/16/20 198 lb (89.8 kg)  06/14/19 202 lb 6.4 oz (91.8 kg)     GEN:  Well nourished, well developed in no acute distress HEENT: Normal NECK: No JVD; No carotid bruits LYMPHATICS: No lymphadenopathy CARDIAC: RRR, no murmurs, rubs, gallops RESPIRATORY:  Clear to auscultation without rales, wheezing or rhonchi  ABDOMEN: Soft, non-tender, non-distended MUSCULOSKELETAL:  No edema; No deformity  SKIN: Warm and dry NEUROLOGIC:  Alert and  oriented x 3 PSYCHIATRIC:  Normal affect    Signed, Shirlee More, MD  04/28/2020 3:44 PM    [facility]

## 2020-05-19 ENCOUNTER — Other Ambulatory Visit: Payer: Self-pay | Admitting: Cardiology

## 2020-05-19 NOTE — Telephone Encounter (Signed)
Refill sent to pharmacy.   

## 2020-08-04 ENCOUNTER — Telehealth: Payer: Self-pay | Admitting: Cardiology

## 2020-08-04 DIAGNOSIS — Z7901 Long term (current) use of anticoagulants: Secondary | ICD-10-CM

## 2020-08-04 NOTE — Telephone Encounter (Signed)
   [facility] Pre-operative Risk Assessment    Patient Name: Wayne [name]  DOB: 06/27/1940  MRN: [id number]   HEARTCARE STAFF: - Please ensure there is not already an duplicate clearance open for this procedure. - Under Visit Info/Reason for Call, type in Other and utilize the format Clearance MM/DD/YY or Clearance TBD. Do not use dashes or single digits. - If request is for dental extraction, please clarify the # of teeth to be extracted. - If the patient is currently at the dentist's office, call Pre-Op APP to address. If the patient is not currently in the dentist office, please route to the Pre-Op pool  Request for surgical clearance:  What type of surgery is being performed? Lumbar injection  When is this surgery scheduled? TBD  What type of clearance is required (medical clearance vs. Pharmacy clearance to hold med vs. Both)? Both  Are there any medications that need to be held prior to surgery and how long? Eliquis, leaving up to cardiology  Practice name and name of physician performing surgery? [facility], Dr. Donivan Scull  What is the office phone number? [phone]   7.   What is the office fax number? [phone]  8.   Anesthesia type (None, local, MAC, general) ? local   Selena Zobro 08/04/2020, 9:46 AM  _________________________________________________________________   (provider comments below)

## 2020-08-04 NOTE — Telephone Encounter (Addendum)
    Wayne [name] DOB:  17-Nov-1940  MRN:  [id number]   Primary Cardiologist: None  Chart reviewed as part of pre-operative protocol coverage. Given past medical history and time since last visit, based on ACC/AHA guidelines, Wayne [name] would be at acceptable risk for the planned procedure without further cardiovascular testing.   Pharmacy to review anticoagulation.   [place], [place] 08/04/2020, 10:07 AM   ADDENDUM:  Per our clinical pharmacist: Platelet count 227.  Based on this information patient okay to hold Eliquis x 3 days prior to lumbar injection

## 2020-08-04 NOTE — Telephone Encounter (Signed)
Patient with diagnosis of atrial fibrillation on Eliquis for anticoagulation.    Procedure: lumbar injection Date of procedure: TBD   CHA2DS2-VASc Score = 4  This indicates a 4.8% annual risk of stroke. The patient's score is based upon: CHF History: No HTN History: Yes Diabetes History: Yes Stroke History: No Vascular Disease History: No Age Score: 2 Gender Score: 0    CrCl 52.5 Platelet count   Patient has not had CBC since Nov 2020.  Please get updated lab for final clearance.

## 2020-08-08 NOTE — Telephone Encounter (Signed)
Pre-op covering staff, can you please notify patient of need for lab work (CBC) so that Pharmacy can give appropriate recommendations for holding Eliquis for upcoming spinal injection?  Thank you!

## 2020-08-09 ENCOUNTER — Other Ambulatory Visit: Payer: Self-pay

## 2020-08-09 DIAGNOSIS — I119 Hypertensive heart disease without heart failure: Secondary | ICD-10-CM

## 2020-08-09 DIAGNOSIS — Z7901 Long term (current) use of anticoagulants: Secondary | ICD-10-CM

## 2020-08-09 NOTE — Telephone Encounter (Signed)
Pt called back and said he had a couple of confusing messages when he got home today. He said he got a message about needing lab work then there was another message injection has been scheduled for 08/15/20 with also giving a hold date for his Eliquis. Pt states he is very confused as to why I left the 2 different messages. I explained to the pt that I only left the message about needing the lab work. I explained to the pt that is was most likely the surgeon's office who called with a scheduled date for the injection. Pt states the surgeon's office just went ahead and scheduled his procedure without talking to him first. I explained to the pt that sometimes they will do that. Pt said he will go get his lab work today at Dr. Joya Gaskins office in [place]. Pt said once lab work done is he ok to still have the injection on 6/28 since the surgeon's office already scheduled him. I assured the pt that the lab work will have to be run past the pre op provider and the cardiologist to be sure ok for clearance. Pt is agreeable to plan of care and thanked me for the help.

## 2020-08-09 NOTE — Telephone Encounter (Signed)
Left message for the pt to call back as he is going to need to have lab work done for pre op clearance. Per pre op Pharm-D pt will need to have a CBC as he is on Eliquis. Pt can have this lab work at Dr. Joya Gaskins office in [place]. I will place the lab order.

## 2020-08-10 ENCOUNTER — Telehealth: Payer: Self-pay

## 2020-08-10 LAB — CBC
Hematocrit: 37.8 % (ref 37.5–51.0)
Hemoglobin: 12.5 g/dL — ABNORMAL LOW (ref 13.0–17.7)
MCH: 30.6 pg (ref 26.6–33.0)
MCHC: 33.1 g/dL (ref 31.5–35.7)
MCV: 93 fL (ref 79–97)
Platelets: 227 10*3/uL (ref 150–450)
RBC: 4.08 x10E6/uL — ABNORMAL LOW (ref 4.14–5.80)
RDW: 13.2 % (ref 11.6–15.4)
WBC: 8.6 10*3/uL (ref 3.4–10.8)

## 2020-08-10 NOTE — Telephone Encounter (Signed)
Spoke with patient regarding results and recommendation.  Patient verbalizes understanding and is agreeable to plan of care. Advised patient to call back with any issues or concerns.  

## 2020-08-10 NOTE — Telephone Encounter (Signed)
-----   Message from Berniece Salines, DO sent at 08/10/2020 10:55 AM EDT ----- This is Dr. Joya Gaskins patient your blood count is slightly low but it appears to be her baseline.

## 2020-08-10 NOTE — Telephone Encounter (Signed)
Platelet count 227.  Based on this information patient okay to hold Eliquis x 3 days prior to lumbar injection

## 2020-08-11 NOTE — Telephone Encounter (Signed)
   Primary Cardiologist: Shirlee More, MD  Chart reviewed as part of pre-operative protocol coverage. Given past medical history and time since last visit, based on ACC/AHA guidelines, Wayne [name] would be at acceptable risk for the planned procedure without further cardiovascular testing.   Patient with diagnosis of atrial fibrillation on Eliquis for anticoagulation.     Procedure: lumbar injection Date of procedure: TBD     CHA2DS2-VASc Score = 4  This indicates a 4.8% annual risk of stroke. The patient's score is based upon: CHF History: No HTN History: Yes Diabetes History: Yes Stroke History: No Vascular Disease History: No Age Score: 2 Gender Score: 0      CrCl 52.5 Platelet count   Platelet count 227.  Based on this information patient okay to hold Eliquis x 3 days prior to lumbar injection  I will route this recommendation to the requesting party via Epic fax function and remove from pre-op pool.  Please call with questions.  Jossie Ng. Wayne Harmon NP-C    08/11/2020, 9:57 AM [address] Office [phone] Fax [phone]

## 2020-08-11 NOTE — Telephone Encounter (Signed)
Will forward to pre op provider for final note as Pharm-D has weighed in on clearance.

## 2020-09-19 ENCOUNTER — Telehealth: Payer: Self-pay | Admitting: Cardiology

## 2020-09-19 NOTE — Telephone Encounter (Signed)
Patient c/o Palpitations:  High priority if patient c/o lightheadedness, shortness of breath, or chest pain  How long have you had palpitations/irregular HR/ Afib? Are you having the symptoms now? A couple weeks  Are you currently experiencing lightheadedness, SOB or CP? lightheadedness  Do you have a history of afib (atrial fibrillation) or irregular heart rhythm? Yes   Have you checked your BP or HR? (document readings if available): yes   Are you experiencing any other symptoms? Dizziness

## 2020-09-19 NOTE — Telephone Encounter (Signed)
No answer at this time. Left message for patient to return the call.

## 2020-09-19 NOTE — Telephone Encounter (Signed)
Called patient again he states he spoke to someone earlier, was set up with an appointment, and spoke to Dr. Bettina Gavia. Patient thanked me for calling me back.

## 2020-09-26 NOTE — Progress Notes (Signed)
Cardiology Office Note:    Date:  09/27/2020   ID:  Wayne [name], DOB 11-22-1940, MRN [id number]  PCP:  Wayne Greenhouse, MD  Cardiologist:  Wayne More, MD    Referring MD: Wayne Greenhouse, MD    ASSESSMENT:    1. Paroxysmal atrial fibrillation (HCC)   2. High risk medication use   3. Chronic anticoagulation   4. Hypertensive heart disease without heart failure    PLAN:    In order of problems listed above:  He was having breakthrough episodes likely of atrial tachycardia increase the dose of his flecainide he is doing well we will check a level today continue the same and with resting bradycardia decreases beta-blocker dosage by 50%.  I would like to see these individuals take back from beta-blocker or calcium channel blocker as at times it can have slow atrial flutter conducting one-to-one on flecainide. Continues anticoagulant Blood pressure is at target continue treatment including ARB Stable diabetes continue current treatment he is on appropriate cardioprotective medications with GLP-1 agonist and SGLT 2 inhibitor Stable hyperlipidemia continue his statin   Next appointment: 6 months   Medication Adjustments/Labs and Tests Ordered: Current medicines are reviewed at length with the patient today.  Concerns regarding medicines are outlined above.  Orders Placed This Encounter  Procedures   Flecainide level   Basic metabolic panel   EKG [id number]    Meds ordered this encounter  Medications   metoprolol succinate (TOPROL-XL) 25 MG 24 hr tablet    Sig: Take 0.5 tablets (12.5 mg total) by mouth daily.    Dispense:  45 tablet    Refill:  3    Requesting 1 year supply   flecainide (TAMBOCOR) 100 MG tablet    Sig: Take 1 tablet (100 mg total) by mouth 2 (two) times daily.    Dispense:  180 tablet    Refill:  3     Chief Complaint  Patient presents with   Follow-up   Atrial Fibrillation     History of Present Illness:    Wayne [name] is a 80 y.o. male  with a hx of paroxysmal atrial fibrillation suppressed with flecainide and anticoagulated and hypertensive heart disease without heart failure and meningioma last seen 04/28/2020. Marland Kitchen Compliance with diet, lifestyle and medications: Yes  [name] is worked into office hours.  About a week ago triage calls were pushed through to the doctor phone and I spoke to him he is having breakthrough episodes of short rapid heart rhythm we increase the dose of his flecainide and he is brought back for follow-up today.  Fortunately has had no recurrence and tolerates the flecainide without side effects.  He had an epidural injection yesterday and briefly left his anticoagulant.  He has had no bleeding with Eliquis.  He is pleased with the quality of his life no edema shortness of breath or syncope. Past Medical History:  Diagnosis Date   A-fib [facility])    Acute kidney injury ([place]) 01/20/2012   Adenomatous colon polyp 12/17/2015   Anemia 01/21/2012   Anemia, pernicious 06/12/2015   Anxiety    Arrhythmia    B12 deficiency    Benign hypertension 06/12/2015   Benign prostatic hyperplasia with urinary obstruction 02/14/2014   Formatting of this note might be different from the original. Managed UROL   Chronic atrial fibrillation ([place]) 06/12/2015   Formatting of this note might be different from the original. Managed PMD   Dehydration 01/20/2012   Dizzy spells 08/31/2018  Formatting of this note might be different from the original. 2020: hosp, neg eval   DM (diabetes mellitus) ([place])    Elevated prostate specific antigen (PSA) 12/10/2010   Formatting of this note might be different from the original. Managed UROL   Family history of abdominal aortic aneurysm 06/13/2015   Formatting of this note might be different from the original. Brother - 2017: sono   HTN (hypertension)    Hyperlipidemia, mixed 01/20/2012   Hypertensive heart disease 01/20/2012   Hypotension 01/20/2012   Lumbar radiculopathy 06/13/2015   Formatting  of this note might be different from the original. 2017: right S1   Meningioma ([place]) 09/01/2018   Formatting of this note might be different from the original. 2020: incidental CT/MRI, right parietal   Obesity 01/21/2012   Obstructive sleep apnea 01/20/2012   Formatting of this note might be different from the original. Managed PMD   OSA (obstructive sleep apnea)    Other osteoporosis without current pathological fracture 12/10/2015   Formatting of this note might be different from the original. 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 2015: 54 2016: 54 2017: 50   SVT (supraventricular tachycardia) ([place]) 01/20/2012   Type 2 diabetes mellitus ([place]) 01/20/2012   Urge incontinence of urine 02/14/2014    History reviewed. No pertinent surgical history.  Current Medications: Current Meds  Medication Sig   alendronate (FOSAMAX) 70 MG tablet TAKE 1 TABLET BY MOUTH  EVERY 7 DAYS (BONE  STRENGTH)   atorvastatin (LIPITOR) 10 MG tablet Take 10 mg by mouth daily.   Calcium Citrate-Vitamin D 315-250 MG-UNIT TABS Take 2 tablets by mouth 2 (two) times daily.   cyanocobalamin (,VITAMIN B-12,) 1000 MCG/ML injection INJECT 1 ML IN THE MUSCLE EVERY 30 DAYS   Dulaglutide (TRULICITY) 1.5 0000000 SOPN Inject 1.5 mg into the skin once a week.   ELIQUIS 5 MG TABS tablet Take 5 mg by mouth 2 (two) times daily.   flecainide (TAMBOCOR) 100 MG tablet Take 1 tablet (100 mg total) by mouth 2 (two) times daily.   INVOKANA 300 MG TABS tablet Take 300 mg by mouth daily.   irbesartan (AVAPRO) 75 MG tablet Take 75 mg by mouth daily.   metFORMIN (GLUCOPHAGE) 500 MG  tablet Take 1,000 mg by mouth 2 (two) times daily.   solifenacin (VESICARE) 10 MG tablet Take 10 mg by mouth daily.   tamsulosin (FLOMAX) 0.4 MG CAPS capsule Take 0.4 mg by mouth daily.   [DISCONTINUED] flecainide (TAMBOCOR) 50 MG tablet TAKE 1 TABLET BY MOUTH  TWICE DAILY (Patient taking differently: Take 2 tablets ( 100 mg ) twice daily)   [DISCONTINUED] metoprolol succinate (TOPROL-XL) 25 MG 24 hr tablet TAKE 1 TABLET BY MOUTH  DAILY     Allergies:   Patient has no known allergies.   Social History   Socioeconomic History   Marital status: Single    Spouse name: Tyron Nakao   Number of children: 2   Years of education: 12   Highest education level: Not on file  Occupational History   Occupation: Retired Airline pilot  Tobacco Use   Smoking status: Never  Smokeless tobacco: Never  Vaping Use   Vaping Use: Never used  Substance and Sexual Activity   Alcohol use: Yes    Alcohol/week: 1.0 - 2.0 standard drink    Types: 1 - 2 Glasses of wine per week    Comment: wine with dinner   Drug use: Never   Sexual activity: Not on file  Other Topics Concern   Not on file  Social History Narrative   Lives with wife   Caffeine use: 2 cups coffee per day, Soda sometimes   Right handed    Social Determinants of Health   Financial Resource Strain: Not on file  Food Insecurity: Not on file  Transportation Needs: Not on file  Physical Activity: Not on file  Stress: Not on file  Social Connections: Not on file     Family History: The patient's family history includes Diabetes in his brother; Heart disease in his father; Kidney disease in his mother. ROS:   Please see the history of present illness.    All other systems reviewed and are negative.  EKGs/Labs/Other Studies Reviewed:    The following studies were reviewed today:  EKG:  EKG ordered today and personally reviewed.  The ekg ordered today demonstrates sinus rhythm first-degree AV block with blocked APCs 56  bpm  Recent Labs: 08/09/2020: Hemoglobin 12.5; Platelets 227  Recent Lipid Panel No results found for: CHOL, TRIG, HDL, CHOLHDL, VLDL, LDLCALC, LDLDIRECT  Physical Exam:    VS:  BP 134/64 (BP Location: Right Arm, Patient Position: Sitting, Cuff Size: Normal)   Pulse (!) 56   Ht 5' 7.5" (1.715 m)   Wt 192 lb 9.6 oz (87.4 kg)   SpO2 99%   BMI 29.72 kg/m     Wt Readings from Last 3 Encounters:  09/27/20 192 lb 9.6 oz (87.4 kg)  04/28/20 197 lb 12.8 oz (89.7 kg)  03/16/20 198 lb (89.8 kg)     GEN:  Well nourished, well developed in no acute distress HEENT: Normal NECK: No JVD; No carotid bruits LYMPHATICS: No lymphadenopathy CARDIAC: RRR, no murmurs, rubs, gallops RESPIRATORY:  Clear to auscultation without rales, wheezing or rhonchi  ABDOMEN: Soft, non-tender, non-distended MUSCULOSKELETAL:  No edema; No deformity  SKIN: Warm and dry NEUROLOGIC:  Alert and oriented x 3 PSYCHIATRIC:  Normal affect    Signed, Wayne More, MD  09/27/2020 8:39 AM    [place]

## 2020-09-27 ENCOUNTER — Ambulatory Visit: Payer: Medicare HMO | Admitting: Cardiology

## 2020-09-27 ENCOUNTER — Encounter: Payer: Self-pay | Admitting: Cardiology

## 2020-09-27 ENCOUNTER — Other Ambulatory Visit: Payer: Self-pay

## 2020-09-27 VITALS — BP 134/64 | HR 56 | Ht 67.5 in | Wt 192.6 lb

## 2020-09-27 DIAGNOSIS — Z79899 Other long term (current) drug therapy: Secondary | ICD-10-CM

## 2020-09-27 DIAGNOSIS — I480 Paroxysmal atrial fibrillation: Secondary | ICD-10-CM

## 2020-09-27 DIAGNOSIS — Z7901 Long term (current) use of anticoagulants: Secondary | ICD-10-CM

## 2020-09-27 DIAGNOSIS — I119 Hypertensive heart disease without heart failure: Secondary | ICD-10-CM | POA: Diagnosis not present

## 2020-09-27 MED ORDER — FLECAINIDE ACETATE 100 MG PO TABS
100.0000 mg | ORAL_TABLET | Freq: Two times a day (BID) | ORAL | 3 refills | Status: DC
Start: 2020-09-27 — End: 2021-10-16

## 2020-09-27 MED ORDER — METOPROLOL SUCCINATE ER 25 MG PO TB24
12.5000 mg | ORAL_TABLET | Freq: Every day | ORAL | 3 refills | Status: DC
Start: 2020-09-27 — End: 2021-10-16

## 2020-09-27 NOTE — Patient Instructions (Signed)
Medication Instructions:  Your physician has recommended you make the following change in your medication: DECREASE: TOPROL XL to 1.2 tablet by mouth daily.  CHANGE: Flecainide 100 mg take one tablet by mouth twice daily.  *If you need a refill on your cardiac medications before your next appointment, please call your pharmacy*   Lab Work: Your physician recommends that you return for lab work in: TODAY BMP, Flecainide level If you have labs (blood work) drawn today and your tests are completely normal, you will receive your results only by: [place] (if you have MyChart) OR A paper copy in the mail If you have any lab test that is abnormal or we need to change your treatment, we will call you to review the results.   Testing/Procedures: None   Follow-Up: At [facility], you and your health needs are our priority.  As part of our continuing mission to provide you with exceptional heart care, we have created designated Provider Care Teams.  These Care Teams include your primary Cardiologist (physician) and Advanced Practice Providers (APPs -  Physician Assistants and Nurse Practitioners) who all work together to provide you with the care you need, when you need it.  We recommend signing up for the patient portal called "MyChart".  Sign up information is provided on this After Visit Summary.  MyChart is used to connect with patients for Virtual Visits (Telemedicine).  Patients are able to view lab/test results, encounter notes, upcoming appointments, etc.  Non-urgent messages can be sent to your provider as well.   To learn more about what you can do with MyChart, go to [email or web address].    Your next appointment:   6 month(s)  The format for your next appointment:   In Person  Provider:   Shirlee More, MD   Other Instructions

## 2020-10-05 LAB — BASIC METABOLIC PANEL
BUN/Creatinine Ratio: 17 (ref 10–24)
BUN: 25 mg/dL (ref 8–27)
CO2: 19 mmol/L — ABNORMAL LOW (ref 20–29)
Calcium: 9.6 mg/dL (ref 8.6–10.2)
Chloride: 103 mmol/L (ref 96–106)
Creatinine, Ser: 1.43 mg/dL — ABNORMAL HIGH (ref 0.76–1.27)
Glucose: 208 mg/dL — ABNORMAL HIGH (ref 65–99)
Potassium: 4.8 mmol/L (ref 3.5–5.2)
Sodium: 140 mmol/L (ref 134–144)
eGFR: 49 mL/min/{1.73_m2} — ABNORMAL LOW (ref >59)

## 2020-10-05 LAB — FLECAINIDE LEVEL: Flecainide: 0.49 ug/mL (ref 0.20–1.00)

## 2020-10-06 ENCOUNTER — Telehealth: Payer: Self-pay

## 2020-10-06 NOTE — Telephone Encounter (Signed)
-----   Message from Richardo Priest, MD sent at 10/05/2020  7:33 PM EDT ----- Good result no changes flecainide level is therapeutic and he has stable CKD

## 2020-10-06 NOTE — Telephone Encounter (Signed)
Patient notified of results.

## 2020-10-30 ENCOUNTER — Ambulatory Visit: Payer: Medicare HMO | Admitting: Cardiology

## 2021-03-19 ENCOUNTER — Ambulatory Visit: Payer: Medicare HMO | Admitting: Neurology

## 2021-03-19 ENCOUNTER — Encounter: Payer: Self-pay | Admitting: Neurology

## 2021-03-19 VITALS — BP 116/62 | HR 80 | Ht 67.5 in | Wt 193.5 lb

## 2021-03-19 DIAGNOSIS — D329 Benign neoplasm of meninges, unspecified: Secondary | ICD-10-CM | POA: Diagnosis not present

## 2021-03-19 DIAGNOSIS — R42 Dizziness and giddiness: Secondary | ICD-10-CM

## 2021-03-19 DIAGNOSIS — I480 Paroxysmal atrial fibrillation: Secondary | ICD-10-CM

## 2021-03-19 NOTE — Progress Notes (Signed)
[facility]  PATIENT: Wayne [name] DOB: 12/02/1940  REFERRING DOCTOR OR PCP: Dr. Shirlee More (cardiology) Dr. Teressa Lower (PCP) SOURCE: Patient's, notes from Dr. Bettina Gavia, imaging reports, CT and MRI images personally reviewed.  _________________________________   HISTORICAL  CHIEF COMPLAINT:  Chief Complaint  Patient presents with   Follow-up    Rm 1, alone. Here for yearly meningioma f/y. Pt reports doing well. No changes since last OV. If needing an MRI he uses [facility].     HISTORY OF PRESENT ILLNESS:  Wayne [name] is a 81 y.o. man with a meningioma  Update 03/19/2021:   Since the last visit, he has noted no new symptoms.   He gets occasioanl dizziness or lightheadedness but this has been a problem for couple years and is not necessarily worse than last year.  He notes no change in his gait.  There is mild reduced balance though probably normal for age.  He denies any specific weakness or numbness.  No difficulties with speech.  No neglect.  MRI of the brain 03/11/2020 [facility]) showed the meningioma.  It is 31 x 18 mm and unchanged compared to  a year earlier and that one was unchanged form 2020.     He sees Dr. Bettina Gavia for atrial fibrillation and has no recent dizzy spells.   He is on Eliquis for stroke prophylaxis.  Meningioma History: He had a spell of nausea and dizziness on 08/27/2018 while at a gas station.   He also noted a general weakness but no specific laterality.   He was taken to the [facility] ED. myocardial infarction and stroke were ruled out.  CT scan did not show any acute findings.  However, he was found to have a large meningioma on CT scan.   He had a follow up MRI that confirmed the meningioma.     Other:   IMAGING MRI of the brain from 08/28/2018. shows a homogenously enhancing dural based mass consistent with a meningioma that indents the posterior right frontal lobe but does not cause adjacent edema.  It was  measured at 30 x 30 x 16 mm.   He also has sinusitis with a mucocele in the right maxillary sinus.  I also looked at the MR angiogram from that day which did not show any significant stenosis in the arteries of the neck or the head.  The CT scan from 08/27/2018 shows the mass with minimal calcification.  He has no older images for comparison.  MRI brain 04/02/2019 at [facility] showed no increase in size when I compared images side by side (though official measurement was 24x31x16 mm)   REVIEW OF SYSTEMS: Constitutional: No fevers, chills, sweats, or change in appetite. Eyes: No visual changes, double vision, eye pain Ear, nose and throat: No hearing loss, ear pain, nasal congestion, sore throat Cardiovascular: No chest pain, palpitations.  He has atrial fibrillation Respiratory:  No shortness of breath at rest or with exertion.   No wheezes GastrointestinaI: No nausea, vomiting, diarrhea, abdominal pain, fecal incontinence Genitourinary:  No dysuria, urinary retention or frequency.  No nocturia. Musculoskeletal:  No neck pain, back pain Integumentary: No rash, pruritus, skin lesions Neurological: as above Psychiatric: No depression at this time.  No anxiety Endocrine: He has well-controlled type 2 diabetes mellitus and is not on insulin. Hematologic/Lymphatic:  No anemia, purpura, petechiae. Allergic/Immunologic: No itchy/runny eyes, nasal congestion, recent allergic reactions, rashes  ALLERGIES: No Known Allergies  HOME MEDICATIONS:  Current Outpatient Medications:  alendronate (FOSAMAX) 70 MG tablet, TAKE 1 TABLET BY MOUTH  EVERY 7 DAYS (BONE  STRENGTH), Disp: , Rfl:    atorvastatin (LIPITOR) 10 MG tablet, Take 10 mg by mouth daily., Disp: , Rfl:    Calcium Citrate-Vitamin D 315-250 MG-UNIT TABS, Take 2 tablets by mouth 2 (two) times daily., Disp: , Rfl:    cyanocobalamin (,VITAMIN B-12,) 1000 MCG/ML injection, INJECT 1 ML IN THE MUSCLE EVERY 30 DAYS, Disp: , Rfl:    Dulaglutide  (TRULICITY) 1.5 BZ/1.6RC SOPN, Inject 1.5 mg into the skin once a week., Disp: , Rfl:    ELIQUIS 5 MG TABS tablet, Take 5 mg by mouth 2 (two) times daily., Disp: , Rfl:    flecainide (TAMBOCOR) 100 MG tablet, Take 1 tablet (100 mg total) by mouth 2 (two) times daily., Disp: 180 tablet, Rfl: 3   INVOKANA 300 MG TABS tablet, Take 300 mg by mouth daily., Disp: , Rfl:    irbesartan (AVAPRO) 75 MG tablet, Take 75 mg by mouth daily., Disp: , Rfl:    metFORMIN (GLUCOPHAGE) 500 MG tablet, Take 1,000 mg by mouth 2 (two) times daily., Disp: , Rfl:    metoprolol succinate (TOPROL-XL) 25 MG 24 hr tablet, Take 0.5 tablets (12.5 mg total) by mouth daily., Disp: 45 tablet, Rfl: 3   solifenacin (VESICARE) 10 MG tablet, Take 10 mg by mouth daily., Disp: , Rfl:    tamsulosin (FLOMAX) 0.4 MG CAPS capsule, Take 0.4 mg by mouth daily., Disp: , Rfl:   PAST MEDICAL HISTORY: Past Medical History:  Diagnosis Date   A-fib ([place])    Acute kidney injury ([place]) 01/20/2012   Adenomatous colon polyp 12/17/2015   Anemia 01/21/2012   Anemia, pernicious 06/12/2015   Anxiety    Arrhythmia    B12 deficiency    Benign hypertension 06/12/2015   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 Managed UROL   Chronic atrial fibrillation ([place]) 06/12/2015   Formatting of this note might be different from the original. Managed PMD   Dehydration 01/20/2012   Dizzy spells 08/31/2018   Formatting of this note might be different from the original. 2020: hosp, neg eval   DM (diabetes mellitus) ([place])    Elevated prostate specific antigen (PSA) 12/10/2010   Formatting of this note might be different from the original. Managed UROL   Family history of abdominal aortic aneurysm 06/13/2015   Formatting of this note might be different from the original. Brother - 2017: sono   HTN (hypertension)    Hyperlipidemia, mixed 01/20/2012   Hypertensive heart disease  01/20/2012   Hypotension 01/20/2012   Lumbar radiculopathy 06/13/2015   Formatting of this note might be different from the original. 2017: right S1   Meningioma ([place]) 09/01/2018   Formatting of this note might be different from the original. 2020: incidental CT/MRI, right parietal   Obesity 01/21/2012   Obstructive sleep apnea 01/20/2012   Formatting of this note might be different from the original. Managed PMD   OSA (obstructive sleep apnea)    Other osteoporosis without current pathological fracture 12/10/2015   Formatting of this note might be different from the original. Managed PMD, had been managed RHEUM, low D and chronic prednisone 2015: hip -2.9 2015: alendronate 70 mg weekly 2017: -2.2   Palpitations    Paroxysmal atrial fibrillation ([place]) 01/29/2019   Polymyalgia rheumatica (HCC)    Prostate nodule    PVC's (premature ventricular contractions)  Stage 3 chronic kidney disease ([place]) 06/12/2015   Formatting of this note might be different from the original. 2015: 54 2016: 54 2017: 50   SVT (supraventricular tachycardia) ([place]) 01/20/2012   Type 2 diabetes mellitus ([place]) 01/20/2012   Urge incontinence of urine 02/14/2014    PAST SURGICAL HISTORY: No past surgical history on file.  FAMILY HISTORY: Family History  Problem Relation Age of Onset   Heart disease Father    Kidney disease Mother    Diabetes Brother     SOCIAL HISTORY:  Social History   Socioeconomic History   Marital status: Single    Spouse name: Brayson Livesey   Number of children: 2   Years of education: 12   Highest education level: Not on file  Occupational History   Occupation: Retired Airline pilot  Tobacco Use   Smoking status: Never   Smokeless tobacco: Never  Vaping Use   Vaping Use: Never used  Substance and Sexual Activity   Alcohol use: Yes    Alcohol/week: 1.0 - 2.0 standard drink    Types: 1 - 2 Glasses of wine per week    Comment: wine with dinner   Drug use: Never   Sexual activity:  Not on file  Other Topics Concern   Not on file  Social History Narrative   Lives with wife   Caffeine use: 2 cups coffee per day, Soda sometimes   Right handed    Social Determinants of Health   Financial Resource Strain: Not on file  Food Insecurity: Not on file  Transportation Needs: Not on file  Physical Activity: Not on file  Stress: Not on file  Social Connections: Not on file  Intimate Partner Violence: Not on file     PHYSICAL EXAM  Vitals:   03/19/21 1042  BP: 116/62  Pulse: 80  SpO2: 99%  Weight: 193 lb 8 oz (87.8 kg)  Height: 5' 7.5" (1.715 m)    Body mass index is 29.86 kg/m.   General: The patient is well-developed and well-nourished and in no acute distress.  Neck with good range of motion.  HEENT:  Head is [place]/AT.  Sclera are anicteric.     Skin: Extremities are without rash or  edema.  Neurologic Exam  Mental status: The patient is alert and oriented x 3 at the time of the examination. The patient has apparent normal recent and remote memory, with an apparently normal attention span and concentration ability.   Speech is normal.  Cranial nerves: Extraocular movements are full.  Facial strength and sensation was normal.    No obvious hearing deficits are noted.  Motor:  Muscle bulk is normal.   Tone is normal. Strength is  5 / 5 in all 4 extremities except 4+ left triceps  Sensory: Sensory testing is intact to pinprick, soft touch and vibration sensation in arms but mild  reduced vibration at toes.  Coordination: Cerebellar testing reveals good finger-nose-finger and heel-to-shin bilaterally.  Gait and station: Station is normal.   Gait is normal. Tandem gait is mildly wide but normal for age. Romberg is negative.   Reflexes: Deep tendon reflexes are symmetric and normal bilaterally.   Plantar responses are flexor.    DIAGNOSTIC DATA (LABS, IMAGING, TESTING) - I reviewed patient records, labs, notes, testing and imaging myself where  available.  Lab Results  Component Value Date   WBC 8.6 08/09/2020   HGB 12.5 (L) 08/09/2020   HCT 37.8 08/09/2020   MCV 93 08/09/2020  PLT 227 08/09/2020      Component Value Date/Time   NA 140 09/27/2020 0834   K 4.8 09/27/2020 0834   CL 103 09/27/2020 0834   CO2 19 (L) 09/27/2020 0834   GLUCOSE 208 (H) 09/27/2020 0834   GLUCOSE 134 (H) 01/21/2012 0700   BUN 25 09/27/2020 0834   CREATININE 1.43 (H) 09/27/2020 0834   CALCIUM 9.6 09/27/2020 0834   GFRNONAA 55 (L) 01/29/2019 1416   GFRAA 63 01/29/2019 1416       ASSESSMENT AND PLAN  Meningioma (HCC)  Paroxysmal atrial fibrillation (HCC)  Dizzy spells  1.  We check MRI of the brain to determine if the meningioma has significantly changed in size.  His exam today was stable compared to last year. 2.  He should continue to stay active and exercise. 3.  He will return in about a year for regular visit or sooner if new or worsening neurologic symptoms.  Cynthya Yam A. Felecia Shelling, MD, [facility] 06/01/2438, 10:27 AM Certified in Neurology, Clinical Neurophysiology, Sleep Medicine and Neuroimaging  [facility] [address], [place], [place] [address] [phone]

## 2021-03-20 ENCOUNTER — Telehealth: Payer: Self-pay | Admitting: Neurology

## 2021-03-20 NOTE — Telephone Encounter (Signed)
Wayne [name]: [id number] (exp. 03/20/21 to 09/16/21) order faxed to [facility] they will reach out to the patient to schedule

## 2021-03-30 ENCOUNTER — Other Ambulatory Visit: Payer: Self-pay

## 2021-03-30 ENCOUNTER — Ambulatory Visit: Payer: Medicare HMO | Admitting: Cardiology

## 2021-03-30 ENCOUNTER — Encounter: Payer: Self-pay | Admitting: Cardiology

## 2021-03-30 VITALS — BP 120/52 | HR 77 | Ht 67.5 in | Wt 197.0 lb

## 2021-03-30 DIAGNOSIS — Z79899 Other long term (current) drug therapy: Secondary | ICD-10-CM

## 2021-03-30 DIAGNOSIS — I119 Hypertensive heart disease without heart failure: Secondary | ICD-10-CM | POA: Diagnosis not present

## 2021-03-30 DIAGNOSIS — E119 Type 2 diabetes mellitus without complications: Secondary | ICD-10-CM

## 2021-03-30 DIAGNOSIS — D329 Benign neoplasm of meninges, unspecified: Secondary | ICD-10-CM

## 2021-03-30 DIAGNOSIS — I480 Paroxysmal atrial fibrillation: Secondary | ICD-10-CM | POA: Diagnosis not present

## 2021-03-30 DIAGNOSIS — Z7901 Long term (current) use of anticoagulants: Secondary | ICD-10-CM

## 2021-03-30 DIAGNOSIS — E782 Mixed hyperlipidemia: Secondary | ICD-10-CM

## 2021-03-30 NOTE — Progress Notes (Signed)
Cardiology Office Note:    Date:  03/30/2021   ID:  Wayne [name], DOB 1940/06/01, MRN [id number]  PCP:  Wayne Greenhouse, MD  Cardiologist:  Wayne More, MD    Referring MD: Wayne Greenhouse, MD    ASSESSMENT:    1. Paroxysmal atrial fibrillation (HCC)   2. High risk medication use   3. Chronic anticoagulation   4. Hypertensive heart disease without heart failure   5. Meningioma ([place])    PLAN:    In order of problems listed above:  Wayne continues to do well his atrial fibrillation suppressed continue flecainide and beta-blocker.  He inquires about ablation I told him I do not think I would advise him in view of his age and good response antiarrhythmic drug and he agrees he has no finding of flecainide toxicity on the surface EKG he tolerates his anticoagulant without bleeding and his meningioma is stable. Blood pressures at target continue beta-blocker Stable diabetes managed by his PCP takes appropriate cardioprotective medication with GLP-1 agonist metformin and SGLT2 inhibitor Continue with statin lipids at target   Next appointment: 6 months   Medication Adjustments/Labs and Tests Ordered: Current medicines are reviewed at length with the patient today.  Concerns regarding medicines are outlined above.  Orders Placed This Encounter  Procedures   EKG 12-Lead   No orders of the defined types were placed in this encounter.   Chief Complaint  Patient presents with   Follow-up   Atrial Fibrillation    History of Present Illness:    Wayne [name] is a 81 y.o. male with a hx of paroxysmal atrial fibrillation anticoagulated maintaining sinus rhythm on flecainide hypertensive heart disease diastolic heart failure and meningioma being followed conservatively last seen 09/27/2020. Compliance with diet, lifestyle and medications: Yes  He continues to do well and has had no recurrent episodes of atrial fibrillation Tolerates his anticoagulant without bleeding he  tells me he is followed by neurology his meningioma is stable and has had no edema shortness of breath chest pain.  Occasionally has palpitation and takes an extra dose of his beta-blocker  He had recent labs with his PCP 01/04/2021: Potassium 4.3 sodium 139 creatinine 1.24 stable CKD GFR 58 cc and fortunately this degree of renal insufficiency does not affect flecainide dose.  Cholesterol is 99 LDL 49 triglycerides 86 Past Medical History:  Diagnosis Date   A-fib ([place])    Acute kidney injury ([place]) 01/20/2012   Adenomatous colon polyp 12/17/2015   Anemia 01/21/2012   Anemia, pernicious 06/12/2015   Anxiety    Arrhythmia    B12 deficiency    Benign hypertension 06/12/2015   Benign prostatic hyperplasia with urinary obstruction 02/14/2014   Formatting of this note might be different from the original. Managed UROL   Chronic atrial fibrillation ([place]) 06/12/2015   Formatting of this note might be different from the original. Managed PMD   Dehydration 01/20/2012   Dizzy spells 08/31/2018   Formatting of this note might be different from the original. 2020: hosp, neg eval   DM (diabetes mellitus) ([place])    Elevated prostate specific antigen (PSA) 12/10/2010   Formatting of this note might be different from the original. Managed UROL   Family history of abdominal aortic aneurysm 06/13/2015   Formatting of this note might be different from the original. Brother - 2017: sono   HTN (hypertension)    Hyperlipidemia, mixed 01/20/2012   Hypertensive heart disease 01/20/2012   Hypotension 01/20/2012   Lumbar radiculopathy 06/13/2015  Formatting of this note might be different from the original. 2017: right S1   Meningioma ([place]) 09/01/2018   Formatting of this note might be different from the original. 2020: incidental CT/MRI, right parietal   Obesity 01/21/2012   Obstructive sleep apnea 01/20/2012   Formatting of this note might be different from the original. Managed PMD   OSA (obstructive sleep apnea)     Other osteoporosis without current pathological fracture 12/10/2015   Formatting of this note might be different from the original. 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 2015: 54 2016: 54 2017: 50   SVT (supraventricular tachycardia) ([place]) 01/20/2012   Type 2 diabetes mellitus ([place]) 01/20/2012   Urge incontinence of urine 02/14/2014    History reviewed. No pertinent surgical history.  Current Medications: Current Meds  Medication Sig   alendronate (FOSAMAX) 70 MG tablet TAKE 1 TABLET BY MOUTH  EVERY 7 DAYS (BONE  STRENGTH)   atorvastatin (LIPITOR) 10 MG tablet Take 10 mg by mouth daily.   Calcium Citrate-Vitamin D 315-250 MG-UNIT TABS Take 2 tablets by mouth 2 (two) times daily.   cyanocobalamin (,VITAMIN B-12,) 1000 MCG/ML injection INJECT 1 ML IN THE MUSCLE EVERY 30 DAYS   Dulaglutide (TRULICITY) 1.5 QQ/5.9DG SOPN Inject 1.5 mg into the skin once a week.   ELIQUIS 5 MG TABS tablet Take 5 mg by mouth 2 (two) times daily.   flecainide (TAMBOCOR) 100 MG tablet Take 1 tablet (100 mg total) by mouth 2 (two) times daily.   INVOKANA 300 MG TABS tablet Take 300 mg by mouth daily.   irbesartan (AVAPRO) 75 MG tablet Take 75 mg by mouth daily.   metFORMIN (GLUCOPHAGE) 500 MG tablet Take 1,000 mg by mouth 2 (two) times daily.   metoprolol succinate (TOPROL-XL) 25 MG 24 hr tablet Take 0.5 tablets (12.5 mg total) by mouth daily.   solifenacin (VESICARE) 10 MG tablet Take 10 mg by mouth daily.   tamsulosin (FLOMAX) 0.4 MG CAPS capsule Take 0.4 mg by mouth daily.     Allergies:   Patient has no known allergies.   Social History    Socioeconomic History   Marital status: Single    Spouse name: Bretton Tandy   Number of children: 2   Years of education: 12   Highest education level: Not on file  Occupational History   Occupation: Retired Airline pilot  Tobacco Use   Smoking status: Never   Smokeless tobacco: Never  Vaping Use   Vaping Use: Never used  Substance and Sexual Activity   Alcohol use: Yes    Alcohol/week: 1.0 - 2.0 standard drink    Types: 1 - 2 Glasses of wine per week    Comment: wine with dinner   Drug use: Never   Sexual activity: Not on file  Other Topics Concern   Not on file  Social History Narrative   Lives with wife   Caffeine use: 2 cups coffee per day, Soda sometimes   Right handed    Social Determinants of Health   Financial Resource Strain: Not on file  Food Insecurity: Not on file  Transportation Needs: Not  on file  Physical Activity: Not on file  Stress: Not on file  Social Connections: Not on file     Family History: The patient's family history includes Diabetes in his brother; Heart disease in his father; Kidney disease in his mother. ROS:   Please see the history of present illness.    All other systems reviewed and are negative.  EKGs/Labs/Other Studies Reviewed:    The following studies were reviewed today:  EKG:  EKG ordered today and personally reviewed.  The ekg ordered today demonstrates sinus rhythm normal narrow QRS no sign of group 1C antiarrhythmic drug toxicity  Recent Labs: 08/09/2020: Hemoglobin 12.5; Platelets 227 09/27/2020: BUN 25; Creatinine, Ser 1.43; Potassium 4.8; Sodium 140    Physical Exam:    VS:  BP (!) 120/52 (BP Location: Left Arm, Patient Position: Sitting, Cuff Size: Normal)    Pulse 77    Ht 5' 7.5" (1.715 m)    Wt 197 lb (89.4 kg)    SpO2 98%    BMI 30.40 kg/m     Wt Readings from Last 3 Encounters:  03/30/21 197 lb (89.4 kg)  03/19/21 193 lb 8 oz (87.8 kg)  09/27/20 192 lb 9.6 oz (87.4 kg)     GEN:  Well nourished, well  developed in no acute distress HEENT: Normal NECK: No JVD; No carotid bruits LYMPHATICS: No lymphadenopathy CARDIAC: RRR, no murmurs, rubs, gallops RESPIRATORY:  Clear to auscultation without rales, wheezing or rhonchi  ABDOMEN: Soft, non-tender, non-distended MUSCULOSKELETAL:  No edema; No deformity  SKIN: Warm and dry NEUROLOGIC:  Alert and oriented x 3 PSYCHIATRIC:  Normal affect    Signed, Wayne More, MD  03/30/2021 8:11 AM    [place]

## 2021-03-30 NOTE — Patient Instructions (Signed)
Medication Instructions:  Your physician recommends that you continue on your current medications as directed. Please refer to the Current Medication list given to you today.  *If you need a refill on your cardiac medications before your next appointment, please call your pharmacy*   Lab Work: None If you have labs (blood work) drawn today and your tests are completely normal, you will receive your results only by: [place] (if you have MyChart) OR A paper copy in the mail If you have any lab test that is abnormal or we need to change your treatment, we will call you to review the results.   Testing/Procedures: None   Follow-Up: At [facility], you and your health needs are our priority.  As part of our continuing mission to provide you with exceptional heart care, we have created designated Provider Care Teams.  These Care Teams include your primary Cardiologist (physician) and Advanced Practice Providers (APPs -  Physician Assistants and Nurse Practitioners) who all work together to provide you with the care you need, when you need it.  We recommend signing up for the patient portal called "MyChart".  Sign up information is provided on this After Visit Summary.  MyChart is used to connect with patients for Virtual Visits (Telemedicine).  Patients are able to view lab/test results, encounter notes, upcoming appointments, etc.  Non-urgent messages can be sent to your provider as well.   To learn more about what you can do with MyChart, go to [email or web address].    Your next appointment:   6 month(s)  The format for your next appointment:   In Person  Provider:   Shirlee More, MD    Other Instructions None

## 2021-04-02 ENCOUNTER — Telehealth: Payer: Self-pay | Admitting: Neurology

## 2021-04-02 NOTE — Telephone Encounter (Signed)
Request made today @ [facility]  [phone]

## 2021-04-02 NOTE — Telephone Encounter (Signed)
Called the patient back. He had his testing completed at [facility]. I advised that we had not received the report or result as of today. Advised that I would have our medical records team request that information and then once we get it, Dr Felecia Shelling will review and advise [place] on what it says. Pt verbalized understanding.

## 2021-04-02 NOTE — Telephone Encounter (Signed)
Pt is asking for a call with results to MRI 

## 2021-10-13 ENCOUNTER — Other Ambulatory Visit: Payer: Self-pay | Admitting: Cardiology

## 2021-10-15 NOTE — Progress Notes (Unsigned)
Cardiology Office Note:    Date:  10/17/2021   ID:  Wayne [name], DOB 11/10/1940, MRN [id number]  PCP:  Algis Greenhouse, MD  Cardiologist:  Shirlee More, MD    Referring MD: Algis Greenhouse, MD    ASSESSMENT:    1. Paroxysmal atrial fibrillation (HCC)   2. High risk medication use   3. Chronic anticoagulation   4. Hypertensive heart disease without heart failure   5. Hyperlipidemia, mixed    PLAN:    In order of problems listed above:  Wayne [name] continues to do well maintaining sinus rhythm on flecainide no side effects or symptoms of toxicity.  Continue flecainide along with his beta-blocker and anticoagulant. Blood pressure at target continue current treatment including beta-blocker ARB LDL at target continue statin its been effective and well-tolerated   Next appointment: 6 months    Medication Adjustments/Labs and Tests Ordered: Current medicines are reviewed at length with the patient today.  Concerns regarding medicines are outlined above.  No orders of the defined types were placed in this encounter.  No orders of the defined types were placed in this encounter.  Chief complaint follow-up atrial fibrillation anticoagulated on flecainide   History of Present Illness:    Wayne [name] is a 81 y.o. male with a hx of paroxysmal atrial fibrillation suppressed with flecainide stable meningioma chronic anticoagulation and hypertensive heart disease without heart failure last seen 03/30/2021.  Compliance with diet, lifestyle and medications: Yes  Recent labs 07/06/2021 [facility] atrium PCP: Creatinine 1.48 potassium 5.0 GFR 47 cc/min Cholesterol 101 LDL 53  He continues to do well no recurrent episodes of atrial fibrillation. He is an apple phone and is interested in a smart watch No palpitation edema shortness of breath chest pain or syncope. He has had no bleeding complication from his anticoagulant And dissipates traveling to [place] I told him I  think he should wear a mask with the recurrence of COVID-19 and to sit close to the restroom I do not think he has a contraindication to airline travel Past Medical History:  Diagnosis Date   A-fib ([place])    Acute kidney injury ([place]) 01/20/2012   Adenomatous colon polyp 12/17/2015   Anemia 01/21/2012   Anemia, pernicious 06/12/2015   Anxiety    Arrhythmia    B12 deficiency    Benign hypertension 06/12/2015   Benign prostatic hyperplasia with urinary obstruction 02/14/2014   Formatting of this note might be different from the original. Managed UROL   Chronic atrial fibrillation ([place]) 06/12/2015   Formatting of this note might be different from the original. Managed PMD   Dehydration 01/20/2012   Dizzy spells 08/31/2018   Formatting of this note might be different from the original. 2020: hosp, neg eval   DM (diabetes mellitus) ([place])    Elevated prostate specific antigen (PSA) 12/10/2010   Formatting of this note might be different from the original. Managed UROL   Family history of abdominal aortic aneurysm 06/13/2015   Formatting of this note might be different from the original. Brother - 2017: sono   HTN (hypertension)    Hyperlipidemia, mixed 01/20/2012   Hypertensive heart disease 01/20/2012   Hypotension 01/20/2012   Lumbar radiculopathy 06/13/2015   Formatting of this note might be different from the original. 2017: right S1   Meningioma ([place]) 09/01/2018   Formatting of this note might be different from the original. 2020: incidental CT/MRI, right parietal   Obesity 01/21/2012   Obstructive sleep apnea 01/20/2012  Formatting of this note might be different from the original. Managed PMD   OSA (obstructive sleep apnea)    Other osteoporosis without current pathological fracture 12/10/2015   Formatting of this note might be different from the original. Managed PMD, had been managed RHEUM, low D and chronic prednisone 2015: hip -2.9 2015: alendronate 70 mg weekly 2017: -2.2   Palpitations     Paroxysmal atrial fibrillation ([place]) 01/29/2019   Polymyalgia rheumatica ([place])    Prostate nodule    PVC's (premature ventricular contractions)    Stage 3 chronic kidney disease (Wayne [name]) 06/12/2015   Formatting of this note might be different from the original. 2015: 54 2016: 54 2017: 50   SVT (supraventricular tachycardia) ([place]) 01/20/2012   Type 2 diabetes mellitus ([place]) 01/20/2012   Urge incontinence of urine 02/14/2014    History reviewed. No pertinent surgical history.  Current Medications: Current Meds  Medication Sig   alendronate (FOSAMAX) 70 MG tablet TAKE 1 TABLET BY MOUTH  EVERY 7 DAYS (BONE  STRENGTH)   atorvastatin (LIPITOR) 10 MG tablet Take 10 mg by mouth daily.   Calcium Citrate-Vitamin D 315-250 MG-UNIT TABS Take 2 tablets by mouth 2 (two) times daily.   cyanocobalamin (,VITAMIN B-12,) 1000 MCG/ML injection INJECT 1 ML IN THE MUSCLE EVERY 30 DAYS   Dulaglutide (TRULICITY) 1.5 IR/6.7EL SOPN Inject 1.5 mg into the skin once a week.   ELIQUIS 5 MG TABS tablet Take 5 mg by mouth 2 (two) times daily.   flecainide (TAMBOCOR) 100 MG tablet TAKE 1 TABLET BY MOUTH  TWICE DAILY   gabapentin (NEURONTIN) 300 MG capsule Take 300 mg by mouth 3 (three) times daily.   INVOKANA 300 MG TABS tablet Take 300 mg by mouth daily.   irbesartan (AVAPRO) 75 MG tablet Take 75 mg by mouth daily.   metFORMIN (GLUCOPHAGE) 500 MG tablet Take 1,000 mg by mouth 2 (two) times daily.   metoprolol succinate (TOPROL-XL) 25 MG 24 hr tablet TAKE ONE-HALF TABLET BY  MOUTH DAILY   solifenacin (VESICARE) 10 MG tablet Take 10 mg by mouth daily.   tamsulosin (FLOMAX) 0.4 MG CAPS capsule Take 0.4 mg by mouth daily.     Allergies:   Patient has no known allergies.   Social History   Socioeconomic History   Marital status: Single    Spouse name: Cullen Lahaie   Number of children: 2   Years of education: 12   Highest education level: Not on file  Occupational History   Occupation: Retired Airline pilot   Tobacco Use   Smoking status: Never   Smokeless tobacco: Never  Vaping Use   Vaping Use: Never used  Substance and Sexual Activity   Alcohol use: Yes    Alcohol/week: 1.0 - 2.0 standard drink of alcohol    Types: 1 - 2 Glasses of wine per week    Comment: wine with dinner   Drug use: Never   Sexual activity: Not on file  Other Topics Concern   Not on file  Social History Narrative   Lives with wife   Caffeine use: 2 cups coffee per day, Soda sometimes   Right handed    Social Determinants of Health   Financial Resource Strain: Not on file  Food Insecurity: Not on file  Transportation Needs: Not on file  Physical Activity: Not on file  Stress: Not on file  Social Connections: Not on file     Family History: The patient's  family history includes Diabetes in his brother;  Heart disease in his father; Kidney disease in his mother. ROS:   Please see the history of present illness.    All other systems reviewed and are negative.  EKGs/Labs/Other Studies Reviewed:    The following studies were reviewed today:  EKG:  EKG ordered today and personally reviewed.  The ekg ordered today demonstrates sinus rhythm marked first-degree AV block PR interval 374 ms QRS duration is not prolonged otherwise normal EKG  R  Physical Exam:    VS:  BP 132/68 (BP Location: Right Arm, Patient Position: Sitting)   Pulse 68   Ht 5' 7.5" (1.715 m)   Wt 188 lb (85.3 kg)   SpO2 99%   BMI 29.01 kg/m     Wt Readings from Last 3 Encounters:  10/17/21 188 lb (85.3 kg)  03/30/21 197 lb (89.4 kg)  03/19/21 193 lb 8 oz (87.8 kg)     GEN:  Well nourished, well developed in no acute distress HEENT: Normal NECK: No JVD; No carotid bruits LYMPHATICS: No lymphadenopathy CARDIAC: RRR, no murmurs, rubs, gallops RESPIRATORY:  Clear to auscultation without rales, wheezing or rhonchi  ABDOMEN: Soft, non-tender, non-distended MUSCULOSKELETAL:  No edema; No deformity  SKIN: Warm and dry NEUROLOGIC:   Alert and oriented x 3 PSYCHIATRIC:  Normal affect    Signed, Shirlee More, MD  10/17/2021 9:58 AM    [place]

## 2021-10-17 ENCOUNTER — Ambulatory Visit: Payer: Medicare HMO | Attending: Cardiology | Admitting: Cardiology

## 2021-10-17 ENCOUNTER — Encounter: Payer: Self-pay | Admitting: Cardiology

## 2021-10-17 VITALS — BP 132/68 | HR 68 | Ht 67.5 in | Wt 188.0 lb

## 2021-10-17 DIAGNOSIS — I119 Hypertensive heart disease without heart failure: Secondary | ICD-10-CM | POA: Diagnosis not present

## 2021-10-17 DIAGNOSIS — Z79899 Other long term (current) drug therapy: Secondary | ICD-10-CM | POA: Diagnosis not present

## 2021-10-17 DIAGNOSIS — E782 Mixed hyperlipidemia: Secondary | ICD-10-CM

## 2021-10-17 DIAGNOSIS — Z7901 Long term (current) use of anticoagulants: Secondary | ICD-10-CM | POA: Diagnosis not present

## 2021-10-17 DIAGNOSIS — I480 Paroxysmal atrial fibrillation: Secondary | ICD-10-CM

## 2021-10-17 NOTE — Patient Instructions (Addendum)
Medication Instructions:  Your physician recommends that you continue on your current medications as directed. Please refer to the Current Medication list given to you today.  *If you need a refill on your cardiac medications before your next appointment, please call your pharmacy*   Lab Work: None If you have labs (blood work) drawn today and your tests are completely normal, you will receive your results only by: [place] (if you have MyChart) OR A paper copy in the mail If you have any lab test that is abnormal or we need to change your treatment, we will call you to review the results.   Testing/Procedures: None   Follow-Up: At [facility], you and your health needs are our priority.  As part of our continuing mission to provide you with exceptional heart care, we have created designated Provider Care Teams.  These Care Teams include your primary Cardiologist (physician) and Advanced Practice Providers (APPs -  Physician Assistants and Nurse Practitioners) who all work together to provide you with the care you need, when you need it.  We recommend signing up for the patient portal called "MyChart".  Sign up information is provided on this After Visit Summary.  MyChart is used to connect with patients for Virtual Visits (Telemedicine).  Patients are able to view lab/test results, encounter notes, upcoming appointments, etc.  Non-urgent messages can be sent to your provider as well.   To learn more about what you can do with MyChart, go to [email or web address].    Your next appointment:   6 month(s)  The format for your next appointment:   In Person  Provider:   Shirlee More, MD    Other Instructions None  Important Information About Sugar         I advise you to purchase a fit or an apple smart watch to monitor for recurrent atrial fibrillation you need your family to set up the app on your iPhone.  1. Avoid all over-the-counter antihistamines  except Claritin/Loratadine and Zyrtec/Cetrizine. 2. Avoid all combination including cold sinus allergies flu decongestant and sleep medications 3. You can use Robitussin DM Mucinex and Mucinex DM for cough. 4. can use Tylenol aspirin ibuprofen and naproxen but no combinations such as sleep or sinus.

## 2021-10-23 ENCOUNTER — Telehealth: Payer: Self-pay | Admitting: Cardiology

## 2021-10-23 DIAGNOSIS — Z7901 Long term (current) use of anticoagulants: Secondary | ICD-10-CM

## 2021-10-23 DIAGNOSIS — Z01818 Encounter for other preprocedural examination: Secondary | ICD-10-CM

## 2021-10-23 NOTE — Telephone Encounter (Signed)
   Pre-operative Risk Assessment    Patient Name: Wayne [name]  DOB: [birth year] MRN: [id number]      Request for Surgical Clearance    Procedure:   Epidural Steroid Injection   Date of Surgery:  Clearance 11/19/21                                 Surgeon:  Dr. Herschel Senegal Group or Practice Name:  [facility] Phone number:  [phone] Fax number:  [phone]   Type of Clearance Requested:   - Pharmacy:  Hold Apixaban (Eliquis) held for 3 days   Type of Anesthesia:  None    Additional requests/questions:    [name]   10/23/2021, 2:52 PM

## 2021-10-26 NOTE — Telephone Encounter (Signed)
Patient with diagnosis of A fib on Eliquis for anticoagulation.    Procedure:  Epidural Steroid Injection    Date of procedure: 11/19/21   CHA2DS2-VASc Score = 4  This indicates a 4.8% annual risk of stroke. The patient's score is based upon: CHF History: 0 HTN History: 1 Diabetes History: 1 Stroke History: 0 Vascular Disease History: 0 Age Score: 2 Gender Score: 0   CrCl 46 mL/min Platelet count Overdue for CBC  Recommend updating CBC before giving final clearance  **This guidance is not considered finalized until pre-operative APP has relayed final recommendations.**

## 2021-10-26 NOTE — Telephone Encounter (Signed)
He needs to have CBC updated in order for [place] to give clearance to hold Eliquis. Please contact patient to arrange lab appointment.

## 2021-10-26 NOTE — Telephone Encounter (Signed)
I s/w the pt and he is agreeable to plan of care for labs CBC to be done for pre op clearance. Pt is over due for CBC per pharm-d. Pt see's Dr. Bettina Gavia in the [facility] office. Pt states he will get labs maybe today if he has time, if not then early next week. I assured the pt that I will place the lab order  so he can go when he is ready. Pt aware once results are in, we will call him with the recommendations for holding his blood thinner for injection. Pt thanked me for the help today.

## 2021-11-01 NOTE — Telephone Encounter (Signed)
Contacted the patient and asked if could have him labs done today; he states his procedure is not until October second and did not understand the rush. I advised the patient that we would like to have clearance completed in a timely manner and not wait to the last minute. He states he will complete labs tomorrow.

## 2021-11-02 ENCOUNTER — Telehealth: Payer: Self-pay | Admitting: Cardiology

## 2021-11-02 NOTE — Telephone Encounter (Signed)
   Pre-operative Risk Assessment    Patient Name: Wayne [name]  DOB: 07-28-1940 MRN: [id number]      Request for Surgical Clearance    Procedure:   Lumbar Transforaminal Epidural Injection  Date of Surgery:  Clearance 11/19/21                                 Surgeon:  Dr. Darlin Drop Group or Practice Name:  [facility] Phone number:  [phone] Fax number:  [phone]   Type of Clearance Requested:   - Pharmacy:  Hold Apixaban (Eliquis) Held 3 days Prior to injection   Type of Anesthesia:  None    Additional requests/questions:  Please advise surgeon/provider what medications should be held.  Signed, Belisicia T Harris   11/02/2021, 3:22 PM

## 2021-11-03 LAB — CBC
Hematocrit: 37.7 % (ref 37.5–51.0)
Hemoglobin: 12.2 g/dL — ABNORMAL LOW (ref 13.0–17.7)
MCH: 30.8 pg (ref 26.6–33.0)
MCHC: 32.4 g/dL (ref 31.5–35.7)
MCV: 95 fL (ref 79–97)
Platelets: 186 10*3/uL (ref 150–450)
RBC: 3.96 x10E6/uL — ABNORMAL LOW (ref 4.14–5.80)
RDW: 13.6 % (ref 11.6–15.4)
WBC: 6.5 10*3/uL (ref 3.4–10.8)

## 2021-11-05 NOTE — Telephone Encounter (Signed)
Will forward to pre op to review lab results that were needed for pre op clearance

## 2021-11-06 NOTE — Telephone Encounter (Signed)
Clearance status refax to [facility]

## 2021-11-06 NOTE — Telephone Encounter (Signed)
   Patient Name: Wayne [name]  DOB: 12/09/1940 MRN: [id number]  Primary Cardiologist: Shirlee More, MD  Chart reviewed as part of pre-operative protocol coverage. Pre-op clearance already addressed by colleagues in earlier phone notes. To summarize recommendations:  -The patient cannot Eliquis 3 days prior to the procedure.  Please restart when medically safe to do so.  Medical clearance was not requested.  Will route this bundled recommendation to requesting provider via Epic fax function and remove from pre-op pool. Please call with questions.  Elgie Collard, PA-C 11/06/2021, 8:51 AM

## 2021-11-06 NOTE — Telephone Encounter (Signed)
Patient with diagnosis of A fib on Eliquis for anticoagulation.     Procedure:  Epidural Steroid Injection    Date of procedure: 11/19/21     CHA2DS2-VASc Score = 4  This indicates a 4.8% annual risk of stroke. The patient's score is based upon: CHF History: 0 HTN History: 1 Diabetes History: 1 Stroke History: 0 Vascular Disease History: 0 Age Score: 2 Gender Score: 0     CrCl 46 mL/min Platelet count: 186K   Patient can hold Eliquis 3 days before procedure   **This guidance is not considered finalized until pre-operative APP has relayed final recommendations.**

## 2021-11-06 NOTE — Telephone Encounter (Signed)
Office calling for update. Please advise  

## 2021-11-06 NOTE — Telephone Encounter (Signed)
   Patient Name: Wayne [name]  DOB: 02/10/1941 MRN: [id number]  Primary Cardiologist: Shirlee More, MD  Chart reviewed as part of pre-operative protocol coverage. Pre-op clearance already addressed by colleagues in earlier phone notes. To summarize recommendations:  -The patient CAN hold Eliquis x 3 days prior to the procedure. Please restart when medically safe to do so.   Will route this bundled recommendation to requesting provider via Epic fax function and remove from pre-op pool. Please call with questions.  Elgie Collard, PA-C 11/06/2021, 8:54 AM

## 2022-03-19 ENCOUNTER — Encounter: Payer: Self-pay | Admitting: Neurology

## 2022-03-19 ENCOUNTER — Ambulatory Visit: Payer: Medicare HMO | Admitting: Neurology

## 2022-03-19 VITALS — BP 126/60 | HR 76 | Ht 67.0 in | Wt 183.5 lb

## 2022-03-19 DIAGNOSIS — R42 Dizziness and giddiness: Secondary | ICD-10-CM

## 2022-03-19 DIAGNOSIS — D329 Benign neoplasm of meninges, unspecified: Secondary | ICD-10-CM | POA: Diagnosis not present

## 2022-03-19 DIAGNOSIS — I480 Paroxysmal atrial fibrillation: Secondary | ICD-10-CM

## 2022-03-19 NOTE — Progress Notes (Signed)
[facility]  PATIENT: Wayne [name] DOB: Mar 09, 1940  REFERRING DOCTOR OR PCP: Dr. Shirlee [name] (cardiology) Dr. Teressa [name] (PCP) SOURCE: Patient's, notes from Dr. Bettina [name], imaging reports, CT and MRI images personally reviewed.  _________________________________   HISTORICAL  CHIEF COMPLAINT:  Chief Complaint  Patient presents with   Room 10    Pt is here Alone. Pt states that his Meningioma hasn't gotten any bigger.     HISTORY OF PRESENT ILLNESS:  Wayne [name] is a 82 y.o. man with a meningioma  Update 03/19/2022:   Since the last visit, he has noted no major new symptoms.  He denies any left side weakness, clumsiness or numbness.   No numbness.  He gets occasioanl dizziness or lightheadedness but this has been a problem for couple years and is not necessarily worse than last year.  He notes no change in his gait.  There is mild reduced balance though probably normal for age.  He denies any specific weakness or numbness.  No difficulties with speech.  No neglect.  He sees Dr. Bettina [name] for atrial fibrillation.  He denies any recent dizzy spells.   He is on Eliquis for stroke prophylaxis and meoprolol  He had urinary retention and UTI while in [place] and needed a foley catheter.   He cut his trip short and is seeing urology.   He has an enlarged prostate and a nodule.     Meningioma History: He had a spell of nausea and dizziness on 08/27/2018 while at a gas station.   He also noted a general weakness but no specific laterality.   He was taken to the [facility] ED. myocardial infarction and stroke were ruled out.  CT scan did not show any acute findings.  However, he was found to have a large meningioma on CT scan.   He had a follow up MRI that confirmed the meningioma.     Other:   IMAGING MRI of the brain from 08/28/2018. shows a homogenously enhancing dural based mass consistent with a meningioma that indents the posterior right frontal  lobe but does not cause adjacent edema.  It was measured at 30 x 30 x 16 mm.   He also has sinusitis with a mucocele in the right maxillary sinus.  I also looked at the MR angiogram from that day which did not show any significant stenosis in the arteries of the neck or the head.  The CT scan from 08/27/2018 shows the mass with minimal calcification.  He has no older images for comparison.  MRI brain 04/02/2019 at [facility] showed no increase in size when I compared images side by side (though official measurement was 24x31x16 mm)  MRI of the brain 03/11/2020 Wayne [name]) showed the meningioma.  It is 31 x 18 mm and unchanged compared to  a year earlier and that one was unchanged form 2020.     MRI brain 03/26/2021 unchanged right frontal convexity meningioma   REVIEW OF SYSTEMS: Constitutional: No fevers, chills, sweats, or change in appetite. Eyes: No visual changes, double vision, eye pain Ear, nose and throat: No hearing loss, ear pain, nasal congestion, sore throat Cardiovascular: No chest pain, palpitations.  He has atrial fibrillation Respiratory:  No shortness of breath at rest or with exertion.   No wheezes GastrointestinaI: No nausea, vomiting, diarrhea, abdominal pain, fecal incontinence Genitourinary:  No dysuria, urinary retention or frequency.  No nocturia. Musculoskeletal:  No neck pain, back pain Integumentary: No rash, pruritus, skin lesions Neurological:  as above Psychiatric: No depression at this time.  No anxiety Endocrine: He has well-controlled type 2 diabetes mellitus and is not on insulin. Hematologic/Lymphatic:  No anemia, purpura, petechiae. Allergic/Immunologic: No itchy/runny eyes, nasal congestion, recent allergic reactions, rashes  ALLERGIES: No Known Allergies  HOME MEDICATIONS:  Current Outpatient Medications:    alendronate (FOSAMAX) 70 MG tablet, TAKE 1 TABLET BY MOUTH  EVERY 7 DAYS (BONE  STRENGTH), Disp: , Rfl:    atorvastatin (LIPITOR) 10 MG tablet, Take 10  mg by mouth daily., Disp: , Rfl:    Calcium Citrate-Vitamin D 315-250 MG-UNIT TABS, Take 2 tablets by mouth 2 (two) times daily., Disp: , Rfl:    cyanocobalamin (,VITAMIN B-12,) 1000 MCG/ML injection, INJECT 1 ML IN THE MUSCLE EVERY 30 DAYS, Disp: , Rfl:    Dulaglutide (TRULICITY) 1.5 TO/6.7TI SOPN, Inject 1.5 mg into the skin once a week., Disp: , Rfl:    ELIQUIS 5 MG TABS tablet, Take 5 mg by mouth 2 (two) times daily., Disp: , Rfl:    flecainide (TAMBOCOR) 100 MG tablet, TAKE 1 TABLET BY MOUTH  TWICE DAILY, Disp: 180 tablet, Rfl: 3   gabapentin (NEURONTIN) 300 MG capsule, Take 300 mg by mouth 3 (three) times daily., Disp: , Rfl:    INVOKANA 300 MG TABS tablet, Take 300 mg by mouth daily., Disp: , Rfl:    irbesartan (AVAPRO) 75 MG tablet, Take 75 mg by mouth daily., Disp: , Rfl:    metFORMIN (GLUCOPHAGE) 500 MG tablet, Take 1,000 mg by mouth 2 (two) times daily., Disp: , Rfl:    metoprolol succinate (TOPROL-XL) 25 MG 24 hr tablet, TAKE ONE-HALF TABLET BY  MOUTH DAILY, Disp: 45 tablet, Rfl: 3   solifenacin (VESICARE) 10 MG tablet, Take 10 mg by mouth daily., Disp: , Rfl:    tamsulosin (FLOMAX) 0.4 MG CAPS capsule, Take 0.4 mg by mouth daily., Disp: , Rfl:   PAST MEDICAL HISTORY: Past Medical History:  Diagnosis Date   A-fib ([place])    Acute kidney injury ([place]) 01/20/2012   Adenomatous colon polyp 12/17/2015   Anemia 01/21/2012   Anemia, pernicious 06/12/2015   Anxiety    Arrhythmia    B12 deficiency    Benign hypertension 06/12/2015   Benign prostatic hyperplasia with urinary obstruction 02/14/2014   Formatting of this note might be different from the original. Managed UROL   Chronic atrial fibrillation ([place]) 06/12/2015   Formatting of this note might be different from the original. Managed PMD   Dehydration 01/20/2012   Dizzy spells 08/31/2018   Formatting of this note might be different from the original. 2020: hosp, neg eval   DM (diabetes mellitus) ([place])    Elevated prostate specific  antigen (PSA) 12/10/2010   Formatting of this note might be different from the original. Managed UROL   Family history of abdominal aortic aneurysm 06/13/2015   Formatting of this note might be different from the original. Brother - 2017: sono   HTN (hypertension)    Hyperlipidemia, mixed 01/20/2012   Hypertensive heart disease 01/20/2012   Hypotension 01/20/2012   Lumbar radiculopathy 06/13/2015   Formatting of this note might be different from the original. 2017: right S1   Meningioma ([employer]) 09/01/2018   Formatting of this note might be different from the original. 2020: incidental CT/MRI, right parietal   Obesity 01/21/2012   Obstructive sleep apnea 01/20/2012   Formatting of this note might be different from the original. Managed PMD   OSA (obstructive sleep apnea)  Other osteoporosis without current pathological fracture 12/10/2015   Formatting of this note might be different from the original. 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 2015: 54 2016: 54 2017: 50   SVT (supraventricular tachycardia) 01/20/2012   Type 2 diabetes mellitus ([place]) 01/20/2012   Urge incontinence of urine 02/14/2014    PAST SURGICAL HISTORY: History reviewed. No pertinent surgical history.  FAMILY HISTORY: Family History  Problem Relation Age of Onset   Heart disease Father    Kidney disease Mother    Diabetes Brother     SOCIAL HISTORY:  Social History   Socioeconomic History   Marital status: Single    Spouse name: Jabree Rebert   Number of children: 2   Years of education: 12   Highest education level: Not on file  Occupational History    Occupation: Retired Airline pilot  Tobacco Use   Smoking status: Never   Smokeless tobacco: Never  Vaping Use   Vaping Use: Never used  Substance and Sexual Activity   Alcohol use: Yes    Alcohol/week: 1.0 - 2.0 standard drink of alcohol    Types: 1 - 2 Glasses of wine per week    Comment: wine with dinner   Drug use: Never   Sexual activity: Not on file  Other Topics Concern   Not on file  Social History Narrative   Lives with wife   Caffeine use: 2 cups coffee per day, Soda sometimes   Right handed    Social Determinants of Health   Financial Resource Strain: Not on file  Food Insecurity: Not on file  Transportation Needs: Not on file  Physical Activity: Not on file  Stress: Not on file  Social Connections: Not on file  Intimate Partner Violence: Not on file     PHYSICAL EXAM  Vitals:   03/19/22 1053  BP: 126/60  Pulse: 76  Weight: 183 lb 8 oz (83.2 kg)  Height: '5\' 7"'$  (1.702 m)    Body mass index is 28.74 kg/m.   General: The patient is well-developed and well-nourished and in no acute distress.  Neck with good range of motion.  HEENT:  Head is [place]/AT.  Sclera are anicteric.     Skin: Extremities are without rash or  edema.  Neurologic Exam  Mental status: The patient is alert and oriented x 3 at the time of the examination. The patient has apparent normal recent and remote memory, with an apparently normal attention span and concentration ability.   Speech is normal.  Cranial nerves: Extraocular movements are full.  Facial strength and sensation was normal.    No obvious hearing deficits are noted.  Motor:  Muscle bulk is normal.   Tone is normal. Strength is  5 / 5 in all 4 extremities.  Rapid altering movements were performed well.  Sensory: Sensory testing is intact to pinprick, soft touch and vibration sensation in arms but mild  reduced vibration at toes.   Coordination: Cerebellar testing reveals good finger-nose-finger and heel-to-shin  bilaterally.  Gait and station: Station is normal.   Gait is normal. Tandem gait is mildly wide but normal  for age. Romberg is negative.   Reflexes: Deep tendon reflexes are symmetric and normal bilaterally.   Plantar responses are flexor.     DIAGNOSTIC DATA (LABS, IMAGING, TESTING) - I reviewed patient records, labs, notes, testing and imaging myself where available.  Lab Results  Component Value Date   WBC 6.5 11/02/2021   HGB 12.2 (L) 11/02/2021   HCT 37.7 11/02/2021   MCV 95 11/02/2021   PLT 186 11/02/2021      Component Value Date/Time   NA 140 09/27/2020 0834   K 4.8 09/27/2020 0834   CL 103 09/27/2020 0834   CO2 19 (L) 09/27/2020 0834   GLUCOSE 208 (H) 09/27/2020 0834   GLUCOSE 134 (H) 01/21/2012 0700   BUN 25 09/27/2020 0834   CREATININE 1.43 (H) 09/27/2020 0834   CALCIUM 9.6 09/27/2020 0834   GFRNONAA 55 (L) 01/29/2019 1416   GFRAA 63 01/29/2019 1416       ASSESSMENT AND PLAN  Meningioma (HCC)  Paroxysmal atrial fibrillation (HCC)  Dizzy spells   1.  His last MRI showed the meningioma was stable. His exam today was stable compared to last year.  We discussed that if symptoms occurred he would most likely note left-sided weakness or clumsiness.  We will need repeat MRI brain for meningioma in early 2025.    2.  He should continue to stay active and exercise. 3.  He will return in about a year for regular visit or sooner if new or worsening neurologic symptoms.  Caliope Ruppert A. Felecia Shelling, MD, [facility] 3/96/7289, 79:15 AM Certified in Neurology, Clinical Neurophysiology, Sleep Medicine and Neuroimaging  [facility] [address] [place], [place] [address] [phone]

## 2022-04-18 ENCOUNTER — Ambulatory Visit: Payer: Medicare HMO | Attending: Cardiology | Admitting: Cardiology

## 2022-04-18 ENCOUNTER — Encounter: Payer: Self-pay | Admitting: Cardiology

## 2022-04-18 VITALS — BP 130/60 | HR 70 | Ht 67.0 in | Wt 185.0 lb

## 2022-04-18 DIAGNOSIS — I119 Hypertensive heart disease without heart failure: Secondary | ICD-10-CM | POA: Diagnosis not present

## 2022-04-18 DIAGNOSIS — Z7901 Long term (current) use of anticoagulants: Secondary | ICD-10-CM

## 2022-04-18 DIAGNOSIS — I480 Paroxysmal atrial fibrillation: Secondary | ICD-10-CM | POA: Diagnosis not present

## 2022-04-18 DIAGNOSIS — Z79899 Other long term (current) drug therapy: Secondary | ICD-10-CM | POA: Diagnosis not present

## 2022-04-18 DIAGNOSIS — D329 Benign neoplasm of meninges, unspecified: Secondary | ICD-10-CM

## 2022-04-18 NOTE — Progress Notes (Signed)
Cardiology Office Note:    Date:  04/18/2022   ID:  Wayne [name], DOB 06-14-1940, MRN [id number]  PCP:  Algis Greenhouse, MD  Cardiologist:  Shirlee More, MD    Referring MD: Algis Greenhouse, MD    ASSESSMENT:    1. Paroxysmal atrial fibrillation (HCC)   2. Chronic anticoagulation   3. High risk medication use   4. Hypertensive heart disease without heart failure   5. Meningioma ([place])    PLAN:    In order of problems listed above:  Continues to do well with maintaining sinus rhythm with flecainide along with his anticoagulant.  If he has surgical intervention he can withdraw his anticoagulant perioperatively. Stable blood pressure target He will continue his ARB beta-blocker  Next appointment: 6 months   Medication Adjustments/Labs and Tests Ordered: Current medicines are reviewed at length with the patient today.  Concerns regarding medicines are outlined above.  No orders of the defined types were placed in this encounter.  No orders of the defined types were placed in this encounter.   Chief Complaint  Patient presents with   Follow-up  With atrial fibrillation  History of Present Illness:    Wayne [name] is a 82 y.o. male with a hx of paroxysmal atrial fibrillation suppressed with flecainide maintaining sinus rhythm hypertensive heart disease without heart failure and meningioma.  He is also chronically anticoagulated last seen 10/17/2021. Recent lab 02/27/2022: Sodium 137 potassium 4.7 creatinine 1.72 GFR 39 cc cholesterol 91 LDL 45 HDL 34 Hemoglobin 11.6 platelets 243,000  Compliance with diet, lifestyle and medications: Yes  Unfortunately travel to [place] developed urinary retention had a Foley catheter placed likely was infected he was delirious returned home and is still doing straight cath. He had no cardiac problems of edema shortness of breath chest pain palpitation or syncope No bleeding complication from his anticoagulant Tolerates his statin  without muscle pain or weakness Past Medical History:  Diagnosis Date   A-fib ([place])    Acute kidney injury ([place]) 01/20/2012   Adenomatous colon polyp 12/17/2015   Anemia 01/21/2012   Anemia, pernicious 06/12/2015   Anxiety    Arrhythmia    B12 deficiency    Benign hypertension 06/12/2015   Benign prostatic hyperplasia with urinary obstruction 02/14/2014   Formatting of this note might be different from the original. Managed UROL   Chronic atrial fibrillation ([place]) 06/12/2015   Formatting of this note might be different from the original. Managed PMD   Dehydration 01/20/2012   Dizzy spells 08/31/2018   Formatting of this note might be different from the original. 2020: hosp, neg eval   DM (diabetes mellitus) ([place])    Elevated prostate specific antigen (PSA) 12/10/2010   Formatting of this note might be different from the original. Managed UROL   Family history of abdominal aortic aneurysm 06/13/2015   Formatting of this note might be different from the original. Brother - 2017: sono   HTN (hypertension)    Hyperlipidemia, mixed 01/20/2012   Hypertensive heart disease 01/20/2012   Hypotension 01/20/2012   Lumbar radiculopathy 06/13/2015   Formatting of this note might be different from the original. 2017: right S1   Meningioma ([place]) 09/01/2018   Formatting of this note might be different from the original. 2020: incidental CT/MRI, right parietal   Obesity 01/21/2012   Obstructive sleep apnea 01/20/2012   Formatting of this note might be different from the original. Managed PMD   OSA (obstructive sleep apnea)    Other osteoporosis  without current pathological fracture 12/10/2015   Formatting of this note might be different from the original. 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 2015: 54 2016: 54 2017: 50   SVT (supraventricular tachycardia) 01/20/2012   Type 2 diabetes mellitus ([place]) 01/20/2012   Urge incontinence of urine 02/14/2014    History reviewed. No pertinent surgical history.  Current Medications: Current Meds  Medication Sig   alendronate (FOSAMAX) 70 MG tablet TAKE 1 TABLET BY MOUTH  EVERY 7 DAYS (BONE  STRENGTH)   atorvastatin (LIPITOR) 10 MG tablet Take 10 mg by mouth daily.   Calcium Citrate-Vitamin D 315-250 MG-UNIT TABS Take 2 tablets by mouth 2 (two) times daily.   cyanocobalamin (,VITAMIN B-12,) 1000 MCG/ML injection INJECT 1 ML IN THE MUSCLE EVERY 30 DAYS   Dulaglutide (TRULICITY) 1.5 0000000 SOPN Inject 1.5 mg into the skin once a week.   ELIQUIS 5 MG TABS tablet Take 5 mg by mouth 2 (two) times daily.   flecainide (TAMBOCOR) 100 MG tablet TAKE 1 TABLET BY MOUTH  TWICE DAILY   gabapentin (NEURONTIN) 300 MG capsule Take 300 mg by mouth 3 (three) times daily.   INVOKANA 300 MG TABS tablet Take 300 mg by mouth daily.   irbesartan (AVAPRO) 75 MG tablet Take 75 mg by mouth daily.   metFORMIN (GLUCOPHAGE) 500 MG tablet Take 1,000 mg by mouth 2 (two) times daily.   metoprolol succinate (TOPROL-XL) 25 MG 24 hr tablet TAKE ONE-HALF TABLET BY  MOUTH DAILY   solifenacin (VESICARE) 10 MG tablet Take 10 mg by mouth daily.   tamsulosin (FLOMAX) 0.4 MG CAPS capsule Take 0.4 mg by mouth daily.     Allergies:   Patient has no known allergies.   Social History   Socioeconomic History   Marital status: Single    Spouse name: Will Chavez   Number of children: 2   Years of education: 12   Highest education level: Not on file  Occupational History   Occupation: Retired Airline pilot  Tobacco Use   Smoking status: Never   Smokeless tobacco: Never  Vaping Use   Vaping Use: Never used  Substance and  Sexual Activity   Alcohol use: Yes    Alcohol/week: 1.0 - 2.0 standard drink of alcohol    Types: 1 - 2 Glasses of wine per week    Comment: wine with dinner   Drug use: Never   Sexual activity: Not on file  Other Topics Concern   Not on file  Social History Narrative   Lives with wife   Caffeine use: 2 cups coffee per day, Soda sometimes   Right handed    Social Determinants of Health   Financial Resource Strain: Not on file  Food Insecurity: Not on file  Transportation Needs: Not on file  Physical Activity: Not on file  Stress: Not on file  Social Connections: Not on file     Family History: The patient's family history includes Diabetes in his brother; Heart disease in his father; Kidney disease in his mother. ROS:   Please see the history of present illness.    All other  systems reviewed and are negative.  EKGs/Labs/Other Studies Reviewed:    The following studies were reviewed today: See history  Physical Exam:    VS:  BP 130/60 (BP Location: Left Arm, Patient Position: Sitting, Cuff Size: Normal)   Pulse 70   Ht '5\' 7"'$  (1.702 m)   Wt 185 lb (83.9 kg)   SpO2 98%   BMI 28.98 kg/m     Wt Readings from Last 3 Encounters:  04/18/22 185 lb (83.9 kg)  03/19/22 183 lb 8 oz (83.2 kg)  10/17/21 188 lb (85.3 kg)     GEN:  Well nourished, well developed in no acute distress HEENT: Normal NECK: No JVD; No carotid bruits LYMPHATICS: No lymphadenopathy CARDIAC: RRR, no murmurs, rubs, gallops RESPIRATORY:  Clear to auscultation without rales, wheezing or rhonchi  ABDOMEN: Soft, non-tender, non-distended MUSCULOSKELETAL:  No edema; No deformity  SKIN: Warm and dry NEUROLOGIC:  Alert and oriented x 3 PSYCHIATRIC:  Normal affect    Signed, Shirlee More, MD  04/18/2022 2:25 PM    [facility]

## 2022-04-18 NOTE — Patient Instructions (Signed)
Medication Instructions:  Your physician recommends that you continue on your current medications as directed. Please refer to the Current Medication list given to you today.  *If you need a refill on your cardiac medications before your next appointment, please call your pharmacy*   Lab Work: None If you have labs (blood work) drawn today and your tests are completely normal, you will receive your results only by: [place] (if you have MyChart) OR A paper copy in the mail If you have any lab test that is abnormal or we need to change your treatment, we will call you to review the results.   Testing/Procedures: None   Follow-Up: At [facility], you and your health needs are our priority.  As part of our continuing mission to provide you with exceptional heart care, we have created designated Provider Care Teams.  These Care Teams include your primary Cardiologist (physician) and Advanced Practice Providers (APPs -  Physician Assistants and Nurse Practitioners) who all work together to provide you with the care you need, when you need it.  We recommend signing up for the patient portal called "MyChart".  Sign up information is provided on this After Visit Summary.  MyChart is used to connect with patients for Virtual Visits (Telemedicine).  Patients are able to view lab/test results, encounter notes, upcoming appointments, etc.  Non-urgent messages can be sent to your provider as well.   To learn more about what you can do with MyChart, go to [email or web address].    Your next appointment:   6 month(s)  Provider:   Shirlee More, MD    Other Instructions None

## 2022-10-17 NOTE — Progress Notes (Signed)
Cardiology Office Note:    Date:  10/18/2022   ID:  Wayne [name], DOB May 24, 1940, MRN [id number]  PCP:  Olive Bass, MD  Cardiologist:  Norman Herrlich, MD    Referring MD: Olive Bass, MD    ASSESSMENT:    1. Paroxysmal atrial fibrillation (HCC)   2. Hypertensive heart disease without heart failure   3. Near syncope    PLAN:    In order of problems listed above:  He is having a syndrome of exertional near syncope differential diagnosis includes anemia related to his anticoagulant versus heart block with physical activity although in general first-degree heart block is not an issue his is very marked PR interval exceeds 0.40 seconds and will utilize a live monitor and suspect will get a quick answer in the next week. I will continue his flecainide minimal dose of beta-blocker anticoagulant if he is having symptomatic bradycardia need a backup pacemaker   Next appointment: 1 month   Medication Adjustments/Labs and Tests Ordered: Current medicines are reviewed at length with the patient today.  Concerns regarding medicines are outlined above.  Orders Placed This Encounter  Procedures   CBC   Flecainide level   LONG TERM MONITOR-LIVE TELEMETRY (3-14 DAYS)   EKG 12-Lead   No orders of the defined types were placed in this encounter.    History of Present Illness:    Wayne [name] is a 82 y.o. male with a hx of paroxysmal atrial fibrillation suppressed with flecainide maintaining sinus rhythm hypertensive chronic anticoagulation heart disease without heart failure and stable meningioma last seen 04/18/2022. Compliance with diet, lifestyle and medications: Yes  He is not doing well he has developed a peculiar pattern of exercise intolerance he starts to do activities he used to do easily he develops profound weakness feels as if he would lose consciousness and afterwards he often has systolic blood pressures of less than 100. He is not having any palpitation he does  not have a smart watch and he is unsure whether or not he is having atrial fibrillation He looks pale on physical exam he is anticoagulant we will check a CBC today He has very marked first-degree heart block PR interval exceeds 0.40 seconds and I am concerned he is having heart block with physical activity on his antiarrhythmic drug He will be arm today with a live monitor to capture episodes in real-time and if present he would require a backup pacemaker the other option would be EP ablation but at age 74 I be hesitant to recommend His EKG does not show signs of flecainide toxicity but also check level today Continue his anticoagulant pending results CBC  Recent labs with his [facility] PCP 10/01/2022 showed a cholesterol 91 LDL 34 triglycerides 116 HDL 37 non-HDL cholesterol 54 CMP 07/29/2022 showed a creatinine 1.57 GFR 43 sodium 137 potassium 5.8 Past Medical History:  Diagnosis Date   A-fib (HCC)    Acute kidney injury (HCC) 01/20/2012   Adenomatous colon polyp 12/17/2015   Anemia 01/21/2012   Anemia, pernicious 06/12/2015   Anxiety    Arrhythmia    B12 deficiency    Benign hypertension 06/12/2015   Benign prostatic hyperplasia with urinary obstruction 02/14/2014   Formatting of this note might be different from the original. Managed UROL   Chronic atrial fibrillation (HCC) 06/12/2015   Formatting of this note might be different from the original. Managed PMD   Dehydration 01/20/2012   Dizzy spells 08/31/2018   Formatting of this  note might be different from the original. 2020: hosp, neg eval   DM (diabetes mellitus) (HCC)    Elevated prostate specific antigen (PSA) 12/10/2010   Formatting of this note might be different from the original. Managed UROL   Family history of abdominal aortic aneurysm 06/13/2015   Formatting of this note might be different from the original. Brother - 2017: sono   HTN (hypertension)    Hyperlipidemia, mixed 01/20/2012   Hypertensive heart disease  01/20/2012   Hypotension 01/20/2012   Lumbar radiculopathy 06/13/2015   Formatting of this note might be different from the original. 2017: right S1   Meningioma (HCC) 09/01/2018   Formatting of this note might be different from the original. 2020: incidental CT/MRI, right parietal   Obesity 01/21/2012   Obstructive sleep apnea 01/20/2012   Formatting of this note might be different from the original. Managed PMD   OSA (obstructive sleep apnea)    Other osteoporosis without current pathological fracture 12/10/2015   Formatting of this note might be different from the original. Managed PMD, had been managed RHEUM, low D and chronic prednisone 2015: hip -2.9 2015: alendronate 70 mg weekly 2017: -2.2   Palpitations    Paroxysmal atrial fibrillation (HCC) 01/29/2019   Polymyalgia rheumatica (HCC)    Prostate nodule    PVC's (premature ventricular contractions)    Stage 3 chronic kidney disease (HCC) 06/12/2015   Formatting of this note might be different from the original. 2015: 54 2016: 54 2017: 50   SVT (supraventricular tachycardia) 01/20/2012   Type 2 diabetes mellitus (HCC) 01/20/2012   Urge incontinence of urine 02/14/2014    Current Medications: Current Meds  Medication Sig   alendronate (FOSAMAX) 70 MG tablet TAKE 1 TABLET BY MOUTH  EVERY 7 DAYS (BONE  STRENGTH)   atorvastatin (LIPITOR) 10 MG tablet Take 10 mg by mouth daily.   Calcium Citrate-Vitamin D 315-250 MG-UNIT TABS Take 2 tablets by mouth 2 (two) times daily.   cyanocobalamin (,VITAMIN B-12,) 1000 MCG/ML injection INJECT 1 ML IN THE MUSCLE EVERY 30 DAYS   Dulaglutide (TRULICITY) 1.5 MG/0.5ML SOPN Inject 1.5 mg into the skin once a week.   ELIQUIS 5 MG TABS tablet Take 5 mg by mouth 2 (two) times daily.   flecainide (TAMBOCOR) 100 MG tablet TAKE 1 TABLET BY MOUTH  TWICE DAILY   gabapentin (NEURONTIN) 300 MG capsule Take 300 mg by mouth 3 (three) times daily.   INVOKANA 300 MG TABS tablet Take 300 mg by mouth daily.   irbesartan  (AVAPRO) 75 MG tablet Take 75 mg by mouth daily.   metFORMIN (GLUCOPHAGE) 500 MG tablet Take 1,000 mg by mouth 2 (two) times daily.   metoprolol succinate (TOPROL-XL) 25 MG 24 hr tablet TAKE ONE-HALF TABLET BY  MOUTH DAILY   solifenacin (VESICARE) 10 MG tablet Take 10 mg by mouth daily.   tamsulosin (FLOMAX) 0.4 MG CAPS capsule Take 0.4 mg by mouth daily.      EKGs/Labs/Other Studies Reviewed:    The following studies were reviewed today: EKG Interpretation Date/Time:  Friday October 18 2022 10:47:03 EDT Ventricular Rate:  57 PR Interval:  410 QRS Duration:  76 QT Interval:  422 QTC Calculation: 410 R Axis:   23  Text Interpretation: Sinus bradycardia with 1st degree A-V block Low voltage QRS Septal infarct (cited on or before 20-Jan-2012) When compared with ECG of 20-Jan-2012 16:08, Premature ventricular complexes are no longer Present PR interval has increased Vent. rate has decreased BY  60  BPM Questionable change in initial forces of Anteroseptal leads Confirmed by Norman Herrlich ([id number]) on 10/18/2022 11:54:09 AM    Recent Labs: 11/02/2021: Hemoglobin 12.2; Platelets 186    Physical Exam:    VS:  BP 130/70 (BP Location: Left Arm, Patient Position: Sitting, Cuff Size: Normal)   Pulse (!) 57   Ht 5\' 7"  (1.702 m)   Wt 185 lb (83.9 kg)   SpO2 99%   BMI 28.98 kg/m     Wt Readings from Last 3 Encounters:  10/18/22 185 lb (83.9 kg)  04/18/22 185 lb (83.9 kg)  03/19/22 183 lb 8 oz (83.2 kg)     GEN:  Well nourished, well developed in no acute distress he appears pale HEENT: Normal NECK: No JVD; No carotid bruits LYMPHATICS: No lymphadenopathy CARDIAC: RRR, no murmurs, rubs, gallops RESPIRATORY:  Clear to auscultation without rales, wheezing or rhonchi  ABDOMEN: Soft, non-tender, non-distended MUSCULOSKELETAL:  No edema; No deformity  SKIN: Warm and dry NEUROLOGIC:  Alert and oriented x 3 PSYCHIATRIC:  Normal affect    Signed, Norman Herrlich, MD  10/18/2022 11:51 AM     [facility]

## 2022-10-18 ENCOUNTER — Ambulatory Visit: Payer: Medicare HMO | Admitting: Cardiology

## 2022-10-18 ENCOUNTER — Ambulatory Visit: Payer: Medicare HMO | Attending: Cardiology

## 2022-10-18 ENCOUNTER — Encounter: Payer: Self-pay | Admitting: Cardiology

## 2022-10-18 VITALS — BP 130/70 | HR 57 | Ht 67.0 in | Wt 185.0 lb

## 2022-10-18 DIAGNOSIS — I480 Paroxysmal atrial fibrillation: Secondary | ICD-10-CM | POA: Diagnosis not present

## 2022-10-18 DIAGNOSIS — R55 Syncope and collapse: Secondary | ICD-10-CM

## 2022-10-18 DIAGNOSIS — I119 Hypertensive heart disease without heart failure: Secondary | ICD-10-CM

## 2022-10-18 NOTE — Patient Instructions (Signed)
Medication Instructions:  Your physician recommends that you continue on your current medications as directed. Please refer to the Current Medication list given to you today.  *If you need a refill on your cardiac medications before your next appointment, please call your pharmacy*   Lab Work: Your physician recommends that you return for lab work in:   Labs today: Flecanide level, CBC  If you have labs (blood work) drawn today and your tests are completely normal, you will receive your results only by: MyChart Message (if you have MyChart) OR A paper copy in the mail If you have any lab test that is abnormal or we need to change your treatment, we will call you to review the results.   Testing/Procedures: A zio monitor was ordered today. It will remain on for 14 days. You will then return monitor and event diary in provided box. It takes 1-2 weeks for report to be downloaded and returned to [place]. We will call you with the results. If monitor falls off or has orange flashing light, please call Zio for further instructions.     Follow-Up: At [facility], you and your health needs are our priority.  As part of our continuing mission to provide you with exceptional heart care, we have created designated Provider Care Teams.  These Care Teams include your primary Cardiologist (physician) and Advanced Practice Providers (APPs -  Physician Assistants and Nurse Practitioners) who all work together to provide you with the care you need, when you need it.  We recommend signing up for the patient portal called "MyChart".  Sign up information is provided on this After Visit Summary.  MyChart is used to connect with patients for Virtual Visits (Telemedicine).  Patients are able to view lab/test results, encounter notes, upcoming appointments, etc.  Non-urgent messages can be sent to your provider as well.   To learn more about what you can do with MyChart, go to [email or web address].     Your next appointment:   6 week(s)  Provider:   Norman Herrlich, MD    Other Instructions None

## 2022-10-19 LAB — CBC
Hematocrit: 33.2 % — ABNORMAL LOW (ref 37.5–51.0)
Hemoglobin: 10.4 g/dL — ABNORMAL LOW (ref 13.0–17.7)
MCH: 30.2 pg (ref 26.6–33.0)
MCHC: 31.3 g/dL — ABNORMAL LOW (ref 31.5–35.7)
MCV: 97 fL (ref 79–97)
Platelets: 169 10*3/uL (ref 150–450)
RBC: 3.44 x10E6/uL — ABNORMAL LOW (ref 4.14–5.80)
RDW: 12.6 % (ref 11.6–15.4)
WBC: 5 10*3/uL (ref 3.4–10.8)

## 2022-10-28 LAB — FLECAINIDE LEVEL: Flecainide: 0.69 ug/mL (ref 0.20–1.00)

## 2022-10-29 ENCOUNTER — Telehealth: Payer: Self-pay

## 2022-10-29 DIAGNOSIS — D649 Anemia, unspecified: Secondary | ICD-10-CM

## 2022-10-29 NOTE — Telephone Encounter (Signed)
-----   Message from Norman Herrlich sent at 10/21/2022  9:50 AM EDT ----- Regarding: FW: He is anemic Needs ferritin iron iron sat TINC and B12 done ----- Message ----- From: Interface, [facility] Lab Results In Sent: 10/19/2022   5:38 AM EDT To: Baldo Daub, MD

## 2022-10-29 NOTE — Telephone Encounter (Signed)
MyChart message

## 2022-11-04 ENCOUNTER — Other Ambulatory Visit: Payer: Self-pay

## 2022-11-04 DIAGNOSIS — D649 Anemia, unspecified: Secondary | ICD-10-CM

## 2022-11-05 ENCOUNTER — Other Ambulatory Visit: Payer: Self-pay | Admitting: Cardiology

## 2022-11-07 LAB — IRON,TIBC AND FERRITIN PANEL
Ferritin: 15 ng/mL — ABNORMAL LOW (ref 30–400)
Iron Saturation: 13 % — ABNORMAL LOW (ref 15–55)
Iron: 43 ug/dL (ref 38–169)
Total Iron Binding Capacity: 322 ug/dL (ref 250–450)
UIBC: 279 ug/dL (ref 111–343)

## 2022-11-07 LAB — VITAMIN B12: Vitamin B-12: >2000 pg/mL — ABNORMAL HIGH (ref 232–1245)

## 2022-11-25 ENCOUNTER — Telehealth: Payer: Self-pay

## 2022-11-25 NOTE — Telephone Encounter (Signed)
-----   Message from Nurse Ladonna Snide F sent at 11/14/2022  7:53 AM EDT -----  ----- Message ----- From: Baldo Daub, MD Sent: 11/14/2022   7:51 AM EDT To: Mickie Bail Ash/Hp Triage  This is a good result  Normal heart rate with activity  No pauses in his heart rhythm no recurrent rapid heartbeats nothing to suggest he needs a permanent pacemaker at this time

## 2022-11-25 NOTE — Telephone Encounter (Signed)
Left vm to return call for lab results and monitor result.  This is a good result   Normal heart rate with activity   No pauses in his heart rhythm no recurrent rapid heartbeats nothing to suggest he needs a permanent pacemaker at this time.  Lab  He is iron deficient please send a copy to his PCP they will need to review whether he is up-to-date or needs colonoscopy   Start iron sulfate 5 grains 1 daily

## 2022-11-25 NOTE — Telephone Encounter (Signed)
-----   Message from Norman Herrlich sent at 11/07/2022  7:32 AM EDT ----- He is iron deficient please send a copy to his PCP they will need to review whether he is up-to-date or needs colonoscopy  Start iron sulfate 5 grains 1 daily

## 2022-11-25 NOTE — Telephone Encounter (Signed)
Left vm to return call.    

## 2022-11-26 MED ORDER — FERROUS SULFATE ER 142 (45 FE) MG PO TBCR: 1.0000 | EXTENDED_RELEASE_TABLET | Freq: Every day | ORAL | 3 refills | Status: AC

## 2022-11-26 NOTE — Telephone Encounter (Signed)
Pt would like to know if he needs to keep his 1 month FU?

## 2022-11-26 NOTE — Addendum Note (Signed)
Addended by: Eleonore Chiquito on: 11/26/2022 08:19 AM   Modules accepted: Orders

## 2022-11-26 NOTE — Telephone Encounter (Signed)
Results reviewed with Bonita Quin per DPR as per Dr. Hulen Shouts note.  Bonita Quin verbalized understanding and had no additional questions. Routed to PCP

## 2022-11-29 ENCOUNTER — Ambulatory Visit: Payer: Medicare HMO | Admitting: Cardiology

## 2022-12-03 DIAGNOSIS — D509 Iron deficiency anemia, unspecified: Secondary | ICD-10-CM | POA: Insufficient documentation

## 2022-12-03 HISTORY — DX: Iron deficiency anemia, unspecified: D50.9

## 2022-12-13 ENCOUNTER — Ambulatory Visit: Payer: Medicare HMO | Admitting: Cardiology

## 2023-01-21 DIAGNOSIS — K769 Liver disease, unspecified: Secondary | ICD-10-CM | POA: Insufficient documentation

## 2023-01-21 HISTORY — DX: Liver disease, unspecified: K76.9

## 2023-01-29 NOTE — Progress Notes (Unsigned)
Cardiology Office Note:    Date:  01/30/2023   ID:  Wayne [name], DOB Sep 26, 1940, MRN [id number]  PCP:  Wayne Bass, MD  Cardiologist:  Wayne Herrlich, MD    Referring MD: Wayne Bass, MD    ASSESSMENT:    1. Paroxysmal atrial fibrillation (HCC)   2. High risk medication use   3. Chronic anticoagulation   4. Hypertensive heart disease without heart failure    PLAN:    In order of problems listed above:  Wayne [name] continues to do well maintaining sinus rhythm with low dose flecainide he has first-degree heart block but no advanced heart block on a recent monitor he is asymptomatic and continue his current cardiac medications including flecainide low-dose metoprolol and his Eliquis Agree with the plan holding his anticoagulant prior to procedure and resume when deemed safe by the urologist I told him he needed need to hold for 48 to 72 hours after surgery he seems reassured   Next appointment: 6 months   Medication Adjustments/Labs and Tests Ordered: Current medicines are reviewed at length with the patient today.  Concerns regarding medicines are outlined above.  Orders Placed This Encounter  Procedures   EKG 12-Lead   No orders of the defined types were placed in this encounter.    History of Present Illness:    Wayne [name] is a 82 y.o. male with a hx of paroxysmal atrial fibrillation hypertensive heart disease without heart failure chronic anticoagulation stable meningioma and anemia last seen 10/18/2022.  An event monitor reported 11/14/2022 with sinus rhythm marked first-degree heart block with PR interval up to 0.40 seconds no pauses or second or third-degree AV nodal block and rare supraventricular and ventricular arrhythmia.  Compliance with diet, lifestyle and medications: Yes  From a cardiology perspective Wayne [name] is doing well no cardiovascular symptoms related to his atrial fibrillation no syncope shortness of breath palpitation and he tolerates his  anticoagulant without bleeding complication He is scheduled for urologic intervention for prostate cancer holmium laser encapsulation of prostate next week Holding his anticoagulant I agreed and told him it is up to his urologist when he resumes I tried to send a message to care teams but I cannot pull up Dr. Wilkie [name] in [place]. Past Medical History:  Diagnosis Date   A-fib [facility])    Acute kidney injury (HCC) 01/20/2012   Adenomatous colon polyp 12/17/2015   Anemia 01/21/2012   Anemia, pernicious 06/12/2015   Anxiety    Arrhythmia    B12 deficiency    Benign hypertension 06/12/2015   Benign prostatic hyperplasia with urinary obstruction 02/14/2014   Formatting of this note might be different from the original. Managed UROL   Chronic atrial fibrillation (HCC) 06/12/2015   Formatting of this note might be different from the original. Managed PMD   Dehydration 01/20/2012   Dizzy spells 08/31/2018   Formatting of this note might be different from the original. 2020: hosp, neg eval   DM (diabetes mellitus) (HCC)    Elevated prostate specific antigen (PSA) 12/10/2010   Formatting of this note might be different from the original. Managed UROL   Family history of abdominal aortic aneurysm 06/13/2015   Formatting of this note might be different from the original. Brother - 2017: sono   HTN (hypertension)    Hyperlipidemia, mixed 01/20/2012   Hypertensive heart disease 01/20/2012   Hypotension 01/20/2012   Lumbar radiculopathy 06/13/2015   Formatting of this note might be different from the original. 2017: right S1  Meningioma (HCC) 09/01/2018   Formatting of this note might be different from the original. 2020: incidental CT/MRI, right parietal   Obesity 01/21/2012   Obstructive sleep apnea 01/20/2012   Formatting of this note might be different from the original. Managed PMD   OSA (obstructive sleep apnea)    Other osteoporosis without current pathological fracture 12/10/2015   Formatting  of this note might be different from the original. Managed PMD, had been managed RHEUM, low D and chronic prednisone 2015: hip -2.9 2015: alendronate 70 mg weekly 2017: -2.2   Palpitations    Paroxysmal atrial fibrillation (HCC) 01/29/2019   Polymyalgia rheumatica (HCC)    Prostate nodule    PVC's (premature ventricular contractions)    Stage 3 chronic kidney disease (HCC) 06/12/2015   Formatting of this note might be different from the original. 2015: 54 2016: 54 2017: 50   SVT (supraventricular tachycardia) (HCC) 01/20/2012   Type 2 diabetes mellitus (HCC) 01/20/2012   Urge incontinence of urine 02/14/2014    Current Medications: Current Meds  Medication Sig   alendronate (FOSAMAX) 70 MG tablet Take 70 mg by mouth once a week.   atorvastatin (LIPITOR) 10 MG tablet Take 10 mg by mouth daily.   Calcium Citrate-Vitamin D 315-250 MG-UNIT TABS Take 2 tablets by mouth 2 (two) times daily.   cyanocobalamin (,VITAMIN B-12,) 1000 MCG/ML injection Inject 1,000 mcg into the muscle every 30 (thirty) days.   Dulaglutide (TRULICITY) 1.5 MG/0.5ML SOPN Inject 1.5 mg into the skin once a week.   ELIQUIS 5 MG TABS tablet Take 5 mg by mouth 2 (two) times daily.   ferrous sulfate ER (IRON SLOW RELEASE) 142 (45 Fe) MG TBCR tablet Take 1 tablet (45 mg of iron total) by mouth daily in the afternoon.   flecainide (TAMBOCOR) 100 MG tablet Take 1 tablet (100 mg total) by mouth 2 (two) times daily.   gabapentin (NEURONTIN) 300 MG capsule Take 300 mg by mouth 3 (three) times daily.   INVOKANA 300 MG TABS tablet Take 300 mg by mouth daily.   irbesartan (AVAPRO) 75 MG tablet Take 75 mg by mouth daily.   metFORMIN (GLUCOPHAGE) 500 MG tablet Take 1,000 mg by mouth 2 (two) times daily.   metoprolol succinate (TOPROL-XL) 25 MG 24 hr tablet Take 0.5 tablets (12.5 mg total) by mouth daily.   solifenacin (VESICARE) 10 MG tablet Take 10 mg by mouth daily.   tamsulosin (FLOMAX) 0.4 MG CAPS capsule Take 0.4 mg by mouth daily.       EKGs/Labs/Other Studies Reviewed:    The following studies were reviewed today:  Cardiac Studies & Procedures        MONITORS  LONG TERM MONITOR-LIVE TELEMETRY (3-14 DAYS) 11/13/2022  Narrative Patch Wear Time:  14 days and 0 hours (2024-08-30T11:49:13-0400 to 2024-09-13T11:49:13-0400)  Patient had a min HR of 52 bpm, max HR of 130 bpm, and avg HR of 65 bpm.  There were 15 triggered events all sinus rhythm and sinus tachycardia without ectopy.  Marked first-degree heart block with seen PR interval up to 0.400 seconds  There were no sinus pauses of 3 seconds or greater and no episodes of second or third-degree AV nodal block.  There were no episodes of SVT atrial fibrillation or flutter  Predominant underlying rhythm was Sinus Rhythm. First Degree AV Block was present.  Isolated SVEs were rare (<1.0%), SVE Couplets were rare (<1.0%), and no SVE Triplets were present.  Isolated VEs were rare (<1.0%), VE Couplets were rare (<  1.0%), and no VE Triplets were present.           EKG Interpretation Date/Time:  Thursday January 30 2023 11:05:57 EST Ventricular Rate:  60 PR Interval:  400 QRS Duration:  84 QT Interval:  416 QTC Calculation: 416 R Axis:   8  Text Interpretation: Sinus rhythm with 1st degree A-V block Low voltage QRS Late transition precordial leads Abnormal ECG When compared with ECG of 18-Oct-2022 10:47, Unchanged Confirmed by Wayne [name] ([id number]) on 01/30/2023 11:14:29 AM   Recent Labs: 10/18/2022: Hemoglobin 10.4; Platelets 169  Recent Lipid Panel No results found for: "CHOL", "TRIG", "HDL", "CHOLHDL", "VLDL", "LDLCALC", "LDLDIRECT"  Physical Exam:    VS:  BP 124/64 (BP Location: Right Arm, Patient Position: Sitting)   Pulse 60   Ht 5' 7.5" (1.715 m)   Wt 189 lb 12.8 oz (86.1 kg)   SpO2 97%   BMI 29.29 kg/m     Wt Readings from Last 3 Encounters:  01/30/23 189 lb 12.8 oz (86.1 kg)  10/18/22 185 lb (83.9 kg)  04/18/22 185 lb (83.9 kg)      GEN:  Well nourished, well developed in no acute distress HEENT: Normal NECK: No JVD; No carotid bruits LYMPHATICS: No lymphadenopathy CARDIAC: RRR, no murmurs, rubs, gallops RESPIRATORY:  Clear to auscultation without rales, wheezing or rhonchi  ABDOMEN: Soft, non-tender, non-distended MUSCULOSKELETAL:  No edema; No deformity  SKIN: Warm and dry NEUROLOGIC:  Alert and oriented x 3 PSYCHIATRIC:  Normal affect    Signed, Wayne Herrlich, MD  01/30/2023 11:59 AM    [facility]

## 2023-01-30 ENCOUNTER — Encounter: Payer: Self-pay | Admitting: Cardiology

## 2023-01-30 ENCOUNTER — Ambulatory Visit: Payer: Medicare HMO | Attending: Cardiology | Admitting: Cardiology

## 2023-01-30 VITALS — BP 124/64 | HR 60 | Ht 67.5 in | Wt 189.8 lb

## 2023-01-30 DIAGNOSIS — Z7901 Long term (current) use of anticoagulants: Secondary | ICD-10-CM | POA: Diagnosis not present

## 2023-01-30 DIAGNOSIS — Z79899 Other long term (current) drug therapy: Secondary | ICD-10-CM | POA: Diagnosis not present

## 2023-01-30 DIAGNOSIS — I480 Paroxysmal atrial fibrillation: Secondary | ICD-10-CM | POA: Diagnosis not present

## 2023-01-30 DIAGNOSIS — I119 Hypertensive heart disease without heart failure: Secondary | ICD-10-CM | POA: Diagnosis not present

## 2023-01-30 NOTE — Patient Instructions (Signed)

## 2023-03-20 ENCOUNTER — Encounter: Payer: Self-pay | Admitting: Neurology

## 2023-03-20 ENCOUNTER — Ambulatory Visit: Payer: Medicare HMO | Admitting: Neurology

## 2023-03-20 VITALS — BP 118/60 | HR 66 | Ht 67.5 in | Wt 187.0 lb

## 2023-03-20 DIAGNOSIS — I4811 Longstanding persistent atrial fibrillation: Secondary | ICD-10-CM

## 2023-03-20 DIAGNOSIS — R42 Dizziness and giddiness: Secondary | ICD-10-CM | POA: Diagnosis not present

## 2023-03-20 DIAGNOSIS — D329 Benign neoplasm of meninges, unspecified: Secondary | ICD-10-CM | POA: Diagnosis not present

## 2023-03-20 NOTE — Progress Notes (Signed)
[facility]  PATIENT: Wayne [name] DOB: [birth year]  REFERRING DOCTOR OR PCP: Dr. Norman Herrlich (cardiology) Dr. Dina Rich (PCP) SOURCE: Patient's, notes from Dr. Dulce Sellar, imaging reports, CT and MRI images personally reviewed.  _________________________________   HISTORICAL  CHIEF COMPLAINT:  Chief Complaint  Patient presents with   Follow-up    RM 11, alone. Last seen 03/19/21.    HISTORY OF PRESENT ILLNESS:  Asia Favata is a 83 y.o. man with a meningioma  Update 03/20/2023:   Since the last visit, he denies new symptoms.  Gait is mildly of balanced but stable and he does not need a cane.  He denies any left side weakness, clumsiness or numbness.   No numbness.    He notes no change in his gait.  There is mild reduced balance though probably normal for age.  He will use the bannister on stairs.    He denies any specific weakness or numbness.  No difficulties with speech.  No neglect.  He sees Dr. Dulce Sellar for atrial fibrillation.  He denies any recent dizzy spells. His Afib is under control.   He is on Eliquis for stroke prophylaxis and meoprolol  He had urinary retention and UTI while in [place] vacationing and needed a foley catheter.   He cut his trip short and is seeing urology.   He has an enlarged prostate and a nodule.      He has had rare incontinence since his last procedure but retention is resolved and he is overall satisfied.     Meningioma History: He had a spell of nausea and dizziness on 08/27/2018 while at a gas station.   He also noted a general weakness but no specific laterality.   He was taken to the [facility] ED. myocardial infarction and stroke were ruled out.  CT scan did not show any acute findings.  However, he was found to have a large meningioma on CT scan.   He had a follow up MRI that confirmed the meningioma.     Other:   IMAGING MRI of the brain from 08/28/2018. shows a homogenously enhancing dural based mass  consistent with a meningioma that indents the posterior right frontal lobe but does not cause adjacent edema.  It was measured at 30 x 30 x 16 mm.   He also has sinusitis with a mucocele in the right maxillary sinus.  I also looked at the MR angiogram from that day which did not show any significant stenosis in the arteries of the neck or the head.  The CT scan from 08/27/2018 shows the mass with minimal calcification.  He has no older images for comparison.  MRI brain 04/02/2019 at [facility] showed no increase in size when I compared images side by side (though official measurement was 24x31x16 mm)  MRI of the brain 03/11/2020 [facility]) showed the meningioma.  It is 31 x 18 mm and unchanged compared to  a year earlier and that one was unchanged form 2020.     MRI brain 03/26/2021 unchanged right frontal convexity meningioma   REVIEW OF SYSTEMS: Constitutional: No fevers, chills, sweats, or change in appetite. Eyes: No visual changes, double vision, eye pain Ear, nose and throat: No hearing loss, ear pain, nasal congestion, sore throat Cardiovascular: No chest pain, palpitations.  He has atrial fibrillation Respiratory:  No shortness of breath at rest or with exertion.   No wheezes GastrointestinaI: No nausea, vomiting, diarrhea, abdominal pain, fecal incontinence Genitourinary:  No dysuria, urinary retention  or frequency.  No nocturia. Musculoskeletal:  No neck pain, back pain Integumentary: No rash, pruritus, skin lesions Neurological: as above Psychiatric: No depression at this time.  No anxiety Endocrine: He has well-controlled type 2 diabetes mellitus and is not on insulin. Hematologic/Lymphatic:  No anemia, purpura, petechiae. Allergic/Immunologic: No itchy/runny eyes, nasal congestion, recent allergic reactions, rashes  ALLERGIES: No Known Allergies  HOME MEDICATIONS:  Current Outpatient Medications:    alendronate (FOSAMAX) 70 MG tablet, Take 70 mg by mouth once a week., Disp: , Rfl:     atorvastatin (LIPITOR) 10 MG tablet, Take 10 mg by mouth daily., Disp: , Rfl:    Calcium Citrate-Vitamin D 315-250 MG-UNIT TABS, Take 2 tablets by mouth 2 (two) times daily., Disp: , Rfl:    cyanocobalamin (,VITAMIN B-12,) 1000 MCG/ML injection, Inject 1,000 mcg into the muscle every 30 (thirty) days., Disp: , Rfl:    Dulaglutide (TRULICITY) 1.5 MG/0.5ML SOPN, Inject 1.5 mg into the skin once a week., Disp: , Rfl:    ELIQUIS 5 MG TABS tablet, Take 5 mg by mouth 2 (two) times daily., Disp: , Rfl:    ferrous sulfate ER (IRON SLOW RELEASE) 142 (45 Fe) MG TBCR tablet, Take 1 tablet (45 mg of iron total) by mouth daily in the afternoon., Disp: 90 tablet, Rfl: 3   flecainide (TAMBOCOR) 100 MG tablet, Take 1 tablet (100 mg total) by mouth 2 (two) times daily., Disp: 180 tablet, Rfl: 2   INVOKANA 300 MG TABS tablet, Take 300 mg by mouth daily., Disp: , Rfl:    irbesartan (AVAPRO) 75 MG tablet, Take 75 mg by mouth daily., Disp: , Rfl:    metFORMIN (GLUCOPHAGE) 500 MG tablet, Take 1,000 mg by mouth 2 (two) times daily., Disp: , Rfl:    metoprolol succinate (TOPROL-XL) 25 MG 24 hr tablet, Take 0.5 tablets (12.5 mg total) by mouth daily., Disp: 45 tablet, Rfl: 2   solifenacin (VESICARE) 10 MG tablet, Take 10 mg by mouth daily., Disp: , Rfl:    tamsulosin (FLOMAX) 0.4 MG CAPS capsule, Take 0.4 mg by mouth daily., Disp: , Rfl:   PAST MEDICAL HISTORY: Past Medical History:  Diagnosis Date   A-fib (HCC)    Acute kidney injury (HCC) 01/20/2012   Adenomatous colon polyp 12/17/2015   Anemia 01/21/2012   Anemia, pernicious 06/12/2015   Anxiety    Arrhythmia    B12 deficiency    Benign hypertension 06/12/2015   Benign prostatic hyperplasia with urinary obstruction 02/14/2014   Formatting of this note might be different from the original. Managed UROL   Chronic atrial fibrillation (HCC) 06/12/2015   Formatting of this note might be different from the original. Managed PMD   Dehydration 01/20/2012   Dizzy  spells 08/31/2018   Formatting of this note might be different from the original. 2020: hosp, neg eval   DM (diabetes mellitus) (HCC)    Elevated prostate specific antigen (PSA) 12/10/2010   Formatting of this note might be different from the original. Managed UROL   Family history of abdominal aortic aneurysm 06/13/2015   Formatting of this note might be different from the original. Brother - 2017: sono   HTN (hypertension)    Hyperlipidemia, mixed 01/20/2012   Hypertensive heart disease 01/20/2012   Hypotension 01/20/2012   Lumbar radiculopathy 06/13/2015   Formatting of this note might be different from the original. 2017: right S1   Meningioma (HCC) 09/01/2018   Formatting of this note might be different from the original. 2020: incidental  CT/MRI, right parietal   Obesity 01/21/2012   Obstructive sleep apnea 01/20/2012   Formatting of this note might be different from the original. Managed PMD   OSA (obstructive sleep apnea)    Other osteoporosis without current pathological fracture 12/10/2015   Formatting of this note might be different from the original. Managed PMD, had been managed RHEUM, low D and chronic prednisone 2015: hip -2.9 2015: alendronate 70 mg weekly 2017: -2.2   Palpitations    Paroxysmal atrial fibrillation (HCC) 01/29/2019   Polymyalgia rheumatica (HCC)    Prostate nodule    PVC's (premature ventricular contractions)    Stage 3 chronic kidney disease (HCC) 06/12/2015   Formatting of this note might be different from the original. 2015: 52 2016: 54 2017: 48   SVT (supraventricular tachycardia) (HCC) 01/20/2012   Type 2 diabetes mellitus (HCC) 01/20/2012   Urge incontinence of urine 02/14/2014    PAST SURGICAL HISTORY: No past surgical history on file.  FAMILY HISTORY: Family History  Problem Relation Age of Onset   Heart disease Father    Kidney disease Mother    Diabetes Brother     SOCIAL HISTORY:  Social History   Socioeconomic History   Marital status:  Single    Spouse name: Nichlas Pitera   Number of children: 2   Years of education: 12   Highest education level: Not on file  Occupational History   Occupation: Retired IT sales professional  Tobacco Use   Smoking status: Never   Smokeless tobacco: Never  Vaping Use   Vaping status: Never Used  Substance and Sexual Activity   Alcohol use: Yes    Alcohol/week: 1.0 - 2.0 standard drink of alcohol    Types: 1 - 2 Glasses of wine per week    Comment: wine with dinner   Drug use: Never   Sexual activity: Not on file  Other Topics Concern   Not on file  Social History Narrative   Lives with wife   Caffeine use: 2 cups coffee per day, Soda sometimes   Right handed    Social Drivers of Corporate investment banker Strain: Not on file  Food Insecurity: Not on file  Transportation Needs: Not on file  Physical Activity: Not on file  Stress: Not on file  Social Connections: Not on file  Intimate Partner Violence: Not on file     PHYSICAL EXAM  Vitals:   03/20/23 1012  BP: 118/60  Pulse: 66  SpO2: 96%  Weight: 187 lb (84.8 kg)  Height: 5' 7.5" (1.715 m)    Body mass index is 28.86 kg/m.   General: The patient is well-developed and well-nourished and in no acute distress.  Neck with good range of motion.  HEENT:  Head is Bode/AT.  Sclera are anicteric.     Skin: Extremities are without rash or  edema.  Neurologic Exam  Mental status: The patient is alert and oriented x 3 at the time of the examination. The patient has apparent normal recent and remote memory, with an apparently normal attention span and concentration ability.   Speech is normal.  Cranial nerves: Extraocular movements are full.  Facial strength and sensation was normal.    No obvious hearing deficits are noted.  Motor:  Muscle bulk is normal.   Tone is normal. Strength is  5 / 5 in all 4 extremities.  Rapid altering movements were performed well.  Sensory: Sensory testing is intact to pinprick, soft touch and  vibration sensation in  arms but mild  reduced vibration at toes.   Coordination: Cerebellar testing reveals good finger-nose-finger and heel-to-shin bilaterally.  Gait and station: Station is normal.  His gait was normal.  The tandem gait was mildly wide but is normal for age. Romberg is negative.   Reflexes: Deep tendon reflexes are symmetric and normal bilaterally.   Plantar responses are flexor.     DIAGNOSTIC DATA (LABS, IMAGING, TESTING) - I reviewed patient records, labs, notes, testing and imaging myself where available.  Lab Results  Component Value Date   WBC 5.0 10/18/2022   HGB 10.4 (L) 10/18/2022   HCT 33.2 (L) 10/18/2022   MCV 97 10/18/2022   PLT 169 10/18/2022      Component Value Date/Time   NA 140 09/27/2020 0834   K 4.8 09/27/2020 0834   CL 103 09/27/2020 0834   CO2 19 (L) 09/27/2020 0834   GLUCOSE 208 (H) 09/27/2020 0834   GLUCOSE 134 (H) 01/21/2012 0700   BUN 25 09/27/2020 0834   CREATININE 1.43 (H) 09/27/2020 0834   CALCIUM 9.6 09/27/2020 0834   GFRNONAA 55 (L) 01/29/2019 1416   GFRAA 63 01/29/2019 1416       ASSESSMENT AND PLAN  Meningioma (HCC) - Plan: MR BRAIN W WO CONTRAST  Longstanding persistent atrial fibrillation (HCC)  Dizzy spells   1.  We discussed the meningioma.  His last MRI showed the meningioma was stable.  His examination is normal for his age.  We discussed that if symptoms occurred he would most likely note left-sided weakness or clumsiness.  We will need repeat MRI brain for meningioma follow-up.  If the spine shows no change we can try to stretch the next MRI to 2 to 3 years.    2.  He should continue to stay active and exercise. 3.  He will return in about a year for regular visit or sooner if new or worsening neurologic symptoms.  Gissele Narducci A. Epimenio Foot, MD, [facility] 03/20/2023, 11:22 AM Certified in Neurology, Clinical Neurophysiology, Sleep Medicine and Neuroimaging  [facility] [address], [address] [place], [place] [address] [phone]

## 2023-03-27 ENCOUNTER — Telehealth: Payer: Self-pay | Admitting: Neurology

## 2023-03-27 NOTE — Telephone Encounter (Signed)
 [employer] Siegfried Dress: [id number] exp. 03/26/23-09/22/23 sent to [facility] MRI [phone]

## 2023-04-17 DIAGNOSIS — R399 Unspecified symptoms and signs involving the genitourinary system: Secondary | ICD-10-CM | POA: Insufficient documentation

## 2023-04-17 HISTORY — DX: Unspecified symptoms and signs involving the genitourinary system: R39.9

## 2023-05-11 ENCOUNTER — Encounter: Payer: Self-pay | Admitting: Neurology

## 2023-06-04 ENCOUNTER — Encounter: Payer: Self-pay | Admitting: Neurology

## 2023-08-18 NOTE — Progress Notes (Unsigned)
 Cardiology Office Note   Date:  08/19/2023  ID:  Wayne [name], DOB 03/04/1940, MRN [id number] PCP: [name], MD  [facility] Providers Cardiologist:  Redell Leiter, MD     History of Present Illness Wayne [name] is a 83 y.o. male with past medical history of atrial fibrillation, hypertension, PVCs, SVT, OSA, DM2, BPH, CKD stage III, polymyalgia rheumatica.  11/14/2022 monitor average heart rate 65 bpm, 15 triggered events associated with sinus rhythm or sinus tachycardia, no pauses greater than 3 seconds, first-degree AV block was present 01/22/2019 monitor predominant sinus rhythm with first-degree AV block  He established with Dr. Leiter in 2020 for evaluation of atrial fibrillation.  Apparently in 2013 he had had an episode that was captured, continue to be bothered by episodes of dizziness which sounded to be consistent with atrial fibrillation.  Most recently evaluated by Dr. Leiter on 01/30/2023, he was maintaining sinus rhythm, on low-dose flecainide , no changes made to plan of care as advised follow-up in 6 months.  He presents today for follow-up of his atrial fibrillation.  He does not have any formal complaints from a cardiac perspective.  He is tolerating his Eliquis without any adverse side effects, including hematochezia hematuria, hemoptysis. He denies chest pain, palpitations, dyspnea, pnd, orthopnea, n, v, dizziness, syncope, edema, weight gain, or early satiety.      Review of Systems  Endo/Heme/Allergies:  Bruises/bleeds easily.  All other systems reviewed and are negative.    Studies Reviewed     Cardiac Studies & Procedures   ______________________________________________________________________________________________        SHERRILEE  LONG TERM MONITOR-LIVE TELEMETRY (3-14 DAYS) 11/13/2022  Narrative Patch Wear Time:  14 days and 0 hours (2024-08-30T11:49:13-0400 to 2024-09-13T11:49:13-0400)  Patient had a min HR of 52 bpm, max HR of  130 bpm, and avg HR of 65 bpm.  There were 15 triggered events all sinus rhythm and sinus tachycardia without ectopy.  Marked first-degree heart block with seen PR interval up to 0.400 seconds  There were no sinus pauses of 3 seconds or greater and no episodes of second or third-degree AV nodal block.  There were no episodes of SVT atrial fibrillation or flutter  Predominant underlying rhythm was Sinus Rhythm. First Degree AV Block was present.  Isolated SVEs were rare (<1.0%), SVE Couplets were rare (<1.0%), and no SVE Triplets were present.  Isolated VEs were rare (<1.0%), VE Couplets were rare (<1.0%), and no VE Triplets were present.       ______________________________________________________________________________________________      Risk Assessment/Calculations  CHA2DS2-VASc Score = 4   This indicates a 4.8% annual risk of stroke. The patient's score is based upon: CHF History: 0 HTN History: 1 Diabetes History: 1 Stroke History: 0 Vascular Disease History: 0 Age Score: 2 Gender Score: 0         Physical Exam VS:  BP (!) 140/70   Pulse 60   Ht 5' 7.5 (1.715 m)   Wt 190 lb (86.2 kg)   SpO2 98%   BMI 29.32 kg/m        Wt Readings from Last 3 Encounters:  08/19/23 190 lb (86.2 kg)  03/20/23 187 lb (84.8 kg)  01/30/23 189 lb 12.8 oz (86.1 kg)    GEN: Well nourished, well developed in no acute distress NECK: No JVD; No carotid bruits CARDIAC: RRR, no murmurs, rubs, gallops RESPIRATORY:  Clear to auscultation without rales, wheezing or rhonchi  ABDOMEN: Soft, non-tender, non-distended EXTREMITIES:  No edema; No deformity  ASSESSMENT AND PLAN Paroxysmal atrial fibrillation/hypercoagulable state/high risk medication use-he is maintaining sinus rhythm however his PR interval is prolonged to 560 ms.  Will decrease his flecainide  to 50 mg twice daily, have him come back next week for repeat EKG and nurse visit.  Continue Eliquis 5 mg twice daily-as no  indication for dose reduction will repeat CBC and BMET-he denies hematochezia, hematuria or hemoptysis. continue metoprolol  12.5 mg daily.  Hypertension-blood pressure slightly elevated 140/70, continue irbesartan 75 mg daily, will continue to monitor for now.   DM2-monitored by his PCP currently on Trulicity.  Dyslipidemia-monitored by his PCP currently on Lipitor 10 mg daily.       Dispo: Decrease flecainide  to 50 mg twice a day, repeat CBC, BMET, flecainide  level.  Return in 1 week for nurse visit for EKG to evaluate his PR interval.  Follow-up in 6 months with Dr. Monetta.  Signed, Delon JAYSON Hoover, NP

## 2023-08-19 ENCOUNTER — Ambulatory Visit: Attending: Cardiology | Admitting: Cardiology

## 2023-08-19 ENCOUNTER — Encounter: Payer: Self-pay | Admitting: Cardiology

## 2023-08-19 VITALS — BP 140/70 | HR 60 | Ht 67.5 in | Wt 190.0 lb

## 2023-08-19 DIAGNOSIS — D6859 Other primary thrombophilia: Secondary | ICD-10-CM | POA: Diagnosis not present

## 2023-08-19 DIAGNOSIS — I10 Essential (primary) hypertension: Secondary | ICD-10-CM

## 2023-08-19 DIAGNOSIS — I480 Paroxysmal atrial fibrillation: Secondary | ICD-10-CM

## 2023-08-19 DIAGNOSIS — Z79899 Other long term (current) drug therapy: Secondary | ICD-10-CM

## 2023-08-19 DIAGNOSIS — E1165 Type 2 diabetes mellitus with hyperglycemia: Secondary | ICD-10-CM

## 2023-08-19 DIAGNOSIS — E782 Mixed hyperlipidemia: Secondary | ICD-10-CM

## 2023-08-19 MED ORDER — FLECAINIDE ACETATE 50 MG PO TABS: 50.0000 mg | ORAL_TABLET | Freq: Two times a day (BID) | ORAL | 3 refills | Status: DC

## 2023-08-19 NOTE — Patient Instructions (Signed)
 Medication Instructions:  Your physician has recommended you make the following change in your medication:   START: Flecanide 50 mg twice daily  *If you need a refill on your cardiac medications before your next appointment, please call your pharmacy*  Lab Work: Your physician recommends that you return for lab work in:   Labs today: CBC, CMP, Flecanide level  If you have labs (blood work) drawn today and your tests are completely normal, you will receive your results only by: MyChart Message (if you have MyChart) OR A paper copy in the mail If you have any lab test that is abnormal or we need to change your treatment, we will call you to review the results.  Testing/Procedures: None  Follow-Up: At [facility], you and your health needs are our priority.  As part of our continuing mission to provide you with exceptional heart care, our providers are all part of one team.  This team includes your primary Cardiologist (physician) and Advanced Practice Providers or APPs (Physician Assistants and Nurse Practitioners) who all work together to provide you with the care you need, when you need it.  Your next appointment:   6 month(s)  Provider:   Redell Leiter, MD    We recommend signing up for the patient portal called MyChart.  Sign up information is provided on this After Visit Summary.  MyChart is used to connect with patients for Virtual Visits (Telemedicine).  Patients are able to view lab/test results, encounter notes, upcoming appointments, etc.  Non-urgent messages can be sent to your provider as well.   To learn more about what you can do with MyChart, go to [email or web address].   Other Instructions Nurse visit next week for EKG due to decreasing dose of Fleanide

## 2023-08-22 ENCOUNTER — Other Ambulatory Visit: Payer: Self-pay | Admitting: Cardiology

## 2023-08-26 ENCOUNTER — Ambulatory Visit: Attending: Cardiology

## 2023-08-26 VITALS — BP 118/70 | HR 78 | Resp 18 | Ht 67.5 in | Wt 187.8 lb

## 2023-08-26 DIAGNOSIS — I480 Paroxysmal atrial fibrillation: Secondary | ICD-10-CM

## 2023-08-26 DIAGNOSIS — Z79899 Other long term (current) drug therapy: Secondary | ICD-10-CM

## 2023-08-26 NOTE — Progress Notes (Signed)
   Nurse Visit   Date of Encounter: 08/26/2023 ID: Wayne [name], DOB [birth year], MRN [id number]  PCP:  [name], MD   [facility] Providers Cardiologist:  Redell Leiter, MD      Visit Details   VS:  BP 118/70 (BP Location: Left Arm, Patient Position: Sitting, Cuff Size: Normal)   Pulse 78   Resp 18   Ht 5' 7.5 (1.715 m)   Wt 187 lb 12.8 oz (85.2 kg)   SpO2 98%   BMI 28.98 kg/m  , BMI Body mass index is 28.98 kg/m.  Wt Readings from Last 3 Encounters:  08/26/23 187 lb 12.8 oz (85.2 kg)  08/19/23 190 lb (86.2 kg)  03/20/23 187 lb (84.8 kg)     Reason for visit: EKG Performed today: Vitals, EKG, Provider consulted:Krasowski, and Education Changes (medications, testing, etc.) : No changes at this time.  Length of Visit: 20 minutes    Medications Adjustments/Labs and Tests Ordered: Orders Placed This Encounter  Procedures   EKG 12-Lead   No orders of the defined types were placed in this encounter.    Signed, Melene Meissner, RN  08/26/2023 11:08 AM

## 2023-08-27 ENCOUNTER — Ambulatory Visit: Payer: Self-pay | Admitting: Cardiology

## 2023-08-27 ENCOUNTER — Other Ambulatory Visit: Payer: Self-pay

## 2023-08-27 LAB — FLECAINIDE LEVEL

## 2023-08-27 LAB — CBC
Hematocrit: 39.3 % (ref 37.5–51.0)
Hemoglobin: 11.8 g/dL — ABNORMAL LOW (ref 13.0–17.7)
MCH: 28.4 pg (ref 26.6–33.0)
MCHC: 30 g/dL — ABNORMAL LOW (ref 31.5–35.7)
MCV: 95 fL (ref 79–97)
Platelets: 217 x10E3/uL (ref 150–450)
RBC: 4.15 x10E6/uL (ref 4.14–5.80)
RDW: 14 % (ref 11.6–15.4)
WBC: 6.1 x10E3/uL (ref 3.4–10.8)

## 2023-08-27 LAB — COMPREHENSIVE METABOLIC PANEL WITH GFR
ALT: 12 IU/L (ref 0–44)
AST: 11 IU/L (ref 0–40)
Albumin: 4.1 g/dL (ref 3.7–4.7)
Alkaline Phosphatase: 63 IU/L (ref 44–121)
BUN/Creatinine Ratio: 16 (ref 10–24)
BUN: 30 mg/dL — ABNORMAL HIGH (ref 8–27)
Bilirubin Total: <0.2 mg/dL (ref 0.0–1.2)
CO2: 20 mmol/L (ref 20–29)
Calcium: 9.3 mg/dL (ref 8.6–10.2)
Chloride: 103 mmol/L (ref 96–106)
Creatinine, Ser: 1.91 mg/dL — ABNORMAL HIGH (ref 0.76–1.27)
Globulin, Total: 2.7 g/dL (ref 1.5–4.5)
Glucose: 154 mg/dL — ABNORMAL HIGH (ref 70–99)
Potassium: 5.2 mmol/L (ref 3.5–5.2)
Sodium: 141 mmol/L (ref 134–144)
Total Protein: 6.8 g/dL (ref 6.0–8.5)
eGFR: 34 mL/min/1.73 — ABNORMAL LOW

## 2023-09-02 ENCOUNTER — Ambulatory Visit: Attending: Cardiology

## 2023-09-02 VITALS — BP 112/54 | HR 80 | Ht 65.0 in | Wt 189.4 lb

## 2023-09-02 DIAGNOSIS — I480 Paroxysmal atrial fibrillation: Secondary | ICD-10-CM

## 2023-09-02 NOTE — Progress Notes (Signed)
   Nurse Visit   Date of Encounter: 09/02/2023 ID: Wayne [name], DOB 05-05-1940, MRN [id number]  PCP:  [name], MD   [facility] Providers Cardiologist:  Redell Leiter, MD      Visit Details   VS:  BP (!) 112/54 (BP Location: Left Arm, Patient Position: Sitting)   Pulse 80   Ht 5' 5 (1.651 m)   Wt 189 lb 6.4 oz (85.9 kg)   SpO2 95%   BMI 31.52 kg/m  , BMI Body mass index is 31.52 kg/m.  Wt Readings from Last 3 Encounters:  09/02/23 189 lb 6.4 oz (85.9 kg)  08/26/23 187 lb 12.8 oz (85.2 kg)  08/19/23 190 lb (86.2 kg)     Reason for visit: Perform EKG Performed today: Vitals, EKG, Provider consulted and Education Changes (medications, testing, etc.) : Follow up with [facility] in 6 weeks. If his heart starts racing or has any discomfort call the office Length of Visit: 25 minutes    Medications Adjustments/Labs and Tests Ordered: Orders Placed This Encounter  Procedures   EKG 12-Lead   No orders of the defined types were placed in this encounter.    Signed, Charlie LILLETTE Free, RN  09/02/2023 11:01 AM

## 2023-09-03 ENCOUNTER — Ambulatory Visit

## 2023-09-16 ENCOUNTER — Ambulatory Visit: Admitting: Cardiology

## 2023-10-08 DIAGNOSIS — Z0000 Encounter for general adult medical examination without abnormal findings: Secondary | ICD-10-CM | POA: Insufficient documentation

## 2023-10-12 NOTE — Progress Notes (Unsigned)
 Cardiology Office Note:    Date:  10/17/2023   ID:  Wayne [name], DOB 1940/12/14, MRN [id number]  PCP:  [name], MD  Cardiologist:  Redell Leiter, MD    Referring MD: [name], MD    ASSESSMENT:    1. First degree AV block   2. Paroxysmal atrial fibrillation (HCC)   3. High risk medication use   4. Chronic anticoagulation   5. Hypertensive heart disease without heart failure   6. Anemia, unspecified type   7. Stage 3b chronic kidney disease (HCC)    PLAN:    In order of problems listed above:  Complex case of sick sinus syndrome paroxysmal atrial fibrillation and significant bradycardia and profound first-degree AV block with suppressant and antiarrhythmic drug therapy. Best served at this time by stopping beta-blocker stop anticoagulant as we have done and continue anticoagulation He will continue to self monitor with his smart watch and he is very fast I will doing that If he documents atrial fibrillation we will do an office EKG apply a monitor and as long as he is rate controlled I would except chronic atrial fibrillation at this point Continue his current antihypertensive list of blood pressures in 2 weeks Stable CKD   Next appointment: 3 months   Medication Adjustments/Labs and Tests Ordered: Current medicines are reviewed at length with the patient today.  Concerns regarding medicines are outlined above.  No orders of the defined types were placed in this encounter.  No orders of the defined types were placed in this encounter.    History of Present Illness:    Wayne [name] is a 83 y.o. male with a hx of paroxysmal atrial fibrillation hypertensive heart disease without heart failure chronic anticoagulation anemia and a stable meningioma.  Last seen by me 01/30/2023.  He has marked first-degree heart block with PR interval up to 0.40 seconds.  Seen again by Delon Hoover nurse practitioner in the PR interval 560 ms flecainide  dosage was  decreased to 50 mg twice daily then subsequently both flecainide  and metoprolol  were discontinued..  EKG 09/02/2023 PR interval 0.40 seconds.  He has stage IIIb CKD  Compliance with diet, lifestyle and medications: Yes  He is apprehensive being off flecainide  and his beta-blocker He has a smart watch he knows how to capture episodes of atrial fibrillation and has had no indication Checking his blood pressure in the morning before medications gets numbers up to 150s systolic we decided to continue his current antihypertensive and delay blood pressure checks until 1 hour after his morning medications and leave a list to my office in a few weeks We discussed the natural history of sick sinus syndrome I think his bradycardia at this time precludes suppressant treatment at some point he will develop atrial fibrillation I think he will have a controlled ventricular rate and the best approach would be to continue anticoagulation I think overall he is not a good candidate for pulmonary vein isolation. If he thinks he is in atrial fibrillation by his smart watch he will contact us  we will do an EKG No chest pain edema shortness of breath palpitation or syncope Past Medical History:  Diagnosis Date   A-fib (HCC)    Acute kidney injury (HCC) 01/20/2012   Adenomatous colon polyp 12/17/2015   Anemia 01/21/2012   Anemia, pernicious 06/12/2015   Anxiety    Arrhythmia    B12 deficiency    Benign hypertension 06/12/2015   Benign prostatic hyperplasia with urinary obstruction 02/14/2014  Formatting of this note might be different from the original. Managed UROL   Chronic atrial fibrillation (HCC) 06/12/2015   Formatting of this note might be different from the original. Managed PMD   Dehydration 01/20/2012   Dizzy spells 08/31/2018   Formatting of this note might be different from the original. 2020: hosp, neg eval   DM (diabetes mellitus) (HCC)    Elevated prostate specific antigen (PSA) 12/10/2010    Formatting of this note might be different from the original. Managed UROL   Family history of abdominal aortic aneurysm 06/13/2015   Formatting of this note might be different from the original. Brother - 2017: sono   HTN (hypertension)    Hyperlipidemia, mixed 01/20/2012   Hypertensive heart disease 01/20/2012   Hypotension 01/20/2012   Iron deficiency anemia 12/03/2022   12/03/2022:     Liver lesion 01/21/2023   01/21/2023: incidental on CT  1. Multiple indeterminate density hepatic lesions, no prior for comparison and limited evaluation in the noncontrast study. Consider MRI abdomen with and without contrast for further evaluation.     Lumbar radiculopathy 06/13/2015   Formatting of this note might be different from the original. 2017: right S1   Meningioma (HCC) 09/01/2018   Formatting of this note might be different from the original. 2020: incidental CT/MRI, right parietal   Obesity 01/21/2012   Obstructive sleep apnea 01/20/2012   Formatting of this note might be different from the original. Managed PMD   OSA (obstructive sleep apnea)    Other osteoporosis without current pathological fracture 12/10/2015   Formatting of this note might be different from the original. Managed PMD, had been managed RHEUM, low D and chronic prednisone 2015: hip -2.9 2015: alendronate 70 mg weekly 2017: -2.2   Palpitations    Paroxysmal atrial fibrillation (HCC) 01/29/2019   Polymyalgia rheumatica (HCC)    Prostate nodule    PVC's (premature ventricular contractions)    Stage 3 chronic kidney disease (HCC) 06/12/2015   Formatting of this note might be different from the original. 2015: 52 2016: 54 2017: 50   SVT (supraventricular tachycardia) (HCC) 01/20/2012   Type 2 diabetes mellitus (HCC) 01/20/2012   Urge incontinence of urine 02/14/2014   Urinary tract infection symptoms 04/17/2023   04/17/2023: abln post op urine, needs repeat with culture per UROL     Wellness examination 10/08/2023     Current Medications: Current Meds  Medication Sig   alendronate (FOSAMAX) 70 MG tablet Take 70 mg by mouth once a week.   atorvastatin  (LIPITOR) 10 MG tablet Take 10 mg by mouth daily.   Calcium  Citrate-Vitamin D 315-250 MG-UNIT TABS Take 2 tablets by mouth 2 (two) times daily.   cyanocobalamin (,VITAMIN B-12,) 1000 MCG/ML injection Inject 1,000 mcg into the muscle every 30 (thirty) days.   Dulaglutide (TRULICITY) 1.5 MG/0.5ML SOPN Inject 1.5 mg into the skin once a week.   ELIQUIS 5 MG TABS tablet Take 5 mg by mouth 2 (two) times daily.   ferrous sulfate  ER (IRON SLOW RELEASE) 142 (45 Fe) MG TBCR tablet Take 1 tablet (45 mg of iron total) by mouth daily in the afternoon.   INVOKANA 300 MG TABS tablet Take 300 mg by mouth daily.   irbesartan (AVAPRO) 75 MG tablet Take 75 mg by mouth daily.   solifenacin (VESICARE) 10 MG tablet Take 10 mg by mouth daily.      EKGs/Labs/Other Studies Reviewed:    The following studies were reviewed today:  Cardiac Studies & Procedures  ______________________________________________________________________________________________        SHERRILEE  LONG TERM MONITOR-LIVE TELEMETRY (3-14 DAYS) 11/13/2022  Narrative Patch Wear Time:  14 days and 0 hours (2024-08-30T11:49:13-0400 to 2024-09-13T11:49:13-0400)  Patient had a min HR of 52 bpm, max HR of 130 bpm, and avg HR of 65 bpm.  There were 15 triggered events all sinus rhythm and sinus tachycardia without ectopy.  Marked first-degree heart block with seen PR interval up to 0.400 seconds  There were no sinus pauses of 3 seconds or greater and no episodes of second or third-degree AV nodal block.  There were no episodes of SVT atrial fibrillation or flutter  Predominant underlying rhythm was Sinus Rhythm. First Degree AV Block was present.  Isolated SVEs were rare (<1.0%), SVE Couplets were rare (<1.0%), and no SVE Triplets were present.  Isolated VEs were rare (<1.0%), VE Couplets were  rare (<1.0%), and no VE Triplets were present.       ______________________________________________________________________________________________          Recent Labs: 08/19/2023: ALT 12; BUN 30; Creatinine, Ser 1.91; Hemoglobin 11.8; Platelets 217; Potassium 5.2; Sodium 141  Recent Lipid Panel No results found for: CHOL, TRIG, HDL, CHOLHDL, VLDL, LDLCALC, LDLDIRECT  Physical Exam:    VS:  BP 132/62   Pulse 66   Ht 5' 7 (1.702 m)   Wt 192 lb (87.1 kg)   SpO2 98%   BMI 30.07 kg/m     Wt Readings from Last 3 Encounters:  10/17/23 192 lb (87.1 kg)  09/02/23 189 lb 6.4 oz (85.9 kg)  08/26/23 187 lb 12.8 oz (85.2 kg)     GEN: Anxious well nourished, well developed in no acute distress HEENT: Normal NECK: No JVD; No carotid bruits LYMPHATICS: No lymphadenopathy CARDIAC: RRR, no murmurs, rubs, gallops RESPIRATORY:  Clear to auscultation without rales, wheezing or rhonchi  ABDOMEN: Soft, non-tender, non-distended MUSCULOSKELETAL:  No edema; No deformity  SKIN: Warm and dry NEUROLOGIC:  Alert and oriented x 3 PSYCHIATRIC:  Normal affect    Signed, Redell Leiter, MD  10/17/2023 11:14 AM    [facility]

## 2023-10-16 ENCOUNTER — Ambulatory Visit: Admitting: Cardiology

## 2023-10-17 ENCOUNTER — Ambulatory Visit: Attending: Cardiology | Admitting: Cardiology

## 2023-10-17 ENCOUNTER — Encounter: Payer: Self-pay | Admitting: Cardiology

## 2023-10-17 VITALS — BP 132/62 | HR 66 | Ht 67.0 in | Wt 192.0 lb

## 2023-10-17 DIAGNOSIS — Z7901 Long term (current) use of anticoagulants: Secondary | ICD-10-CM

## 2023-10-17 DIAGNOSIS — D649 Anemia, unspecified: Secondary | ICD-10-CM

## 2023-10-17 DIAGNOSIS — I480 Paroxysmal atrial fibrillation: Secondary | ICD-10-CM

## 2023-10-17 DIAGNOSIS — I119 Hypertensive heart disease without heart failure: Secondary | ICD-10-CM

## 2023-10-17 DIAGNOSIS — Z79899 Other long term (current) drug therapy: Secondary | ICD-10-CM | POA: Diagnosis not present

## 2023-10-17 DIAGNOSIS — N1832 Chronic kidney disease, stage 3b: Secondary | ICD-10-CM

## 2023-10-17 DIAGNOSIS — I440 Atrioventricular block, first degree: Secondary | ICD-10-CM

## 2023-10-17 NOTE — Patient Instructions (Signed)
 Medication Instructions:  Your physician recommends that you continue on your current medications as directed. Please refer to the Current Medication list given to you today.  *If you need a refill on your cardiac medications before your next appointment, please call your pharmacy*  Lab Work: None If you have labs (blood work) drawn today and your tests are completely normal, you will receive your results only by: MyChart Message (if you have MyChart) OR A paper copy in the mail If you have any lab test that is abnormal or we need to change your treatment, we will call you to review the results.  Testing/Procedures: None  Follow-Up: At [facility], you and your health needs are our priority.  As part of our continuing mission to provide you with exceptional heart care, our providers are all part of one team.  This team includes your primary Cardiologist (physician) and Advanced Practice Providers or APPs (Physician Assistants and Nurse Practitioners) who all work together to provide you with the care you need, when you need it.  Your next appointment:   6 month(s)  Provider:   Redell Leiter, MD    We recommend signing up for the patient portal called MyChart.  Sign up information is provided on this After Visit Summary.  MyChart is used to connect with patients for Virtual Visits (Telemedicine).  Patients are able to view lab/test results, encounter notes, upcoming appointments, etc.  Non-urgent messages can be sent to your provider as well.   To learn more about what you can do with MyChart, go to [email or web address].   Other Instructions Check home blood pressures 1 - 2 hours after morning medication.

## 2024-03-17 ENCOUNTER — Encounter: Payer: Self-pay | Admitting: Cardiology

## 2024-03-18 ENCOUNTER — Ambulatory Visit: Payer: Medicare HMO | Admitting: Neurology

## 2024-03-25 ENCOUNTER — Telehealth: Payer: Self-pay | Admitting: Neurology

## 2024-03-25 NOTE — Telephone Encounter (Signed)
"   Request to reschedule appointment, because of weather. "

## 2024-03-26 ENCOUNTER — Ambulatory Visit: Admitting: Neurology

## 2024-04-20 ENCOUNTER — Ambulatory Visit: Admitting: Cardiology

## 2024-08-03 ENCOUNTER — Ambulatory Visit: Admitting: Neurology
